# Patient Record
Sex: Male | Born: 1950 | Race: White | Hispanic: No | Marital: Married | Smoking: Never smoker
Health system: Southern US, Community
[De-identification: ages and names within clinical notes are randomized; demographics above are authoritative.]

## PROBLEM LIST (undated history)

## (undated) DIAGNOSIS — M1711 Unilateral primary osteoarthritis, right knee: Secondary | ICD-10-CM

## (undated) DIAGNOSIS — E785 Hyperlipidemia, unspecified: Secondary | ICD-10-CM

## (undated) DIAGNOSIS — N400 Enlarged prostate without lower urinary tract symptoms: Secondary | ICD-10-CM

## (undated) DIAGNOSIS — M549 Dorsalgia, unspecified: Secondary | ICD-10-CM

## (undated) DIAGNOSIS — E119 Type 2 diabetes mellitus without complications: Secondary | ICD-10-CM

## (undated) DIAGNOSIS — M2550 Pain in unspecified joint: Secondary | ICD-10-CM

## (undated) DIAGNOSIS — F419 Anxiety disorder, unspecified: Secondary | ICD-10-CM

## (undated) DIAGNOSIS — F32A Depression, unspecified: Secondary | ICD-10-CM

## (undated) DIAGNOSIS — I10 Essential (primary) hypertension: Secondary | ICD-10-CM

## (undated) DIAGNOSIS — F329 Major depressive disorder, single episode, unspecified: Secondary | ICD-10-CM

## (undated) DIAGNOSIS — M109 Gout, unspecified: Secondary | ICD-10-CM

## (undated) DIAGNOSIS — R0602 Shortness of breath: Secondary | ICD-10-CM

## (undated) DIAGNOSIS — R972 Elevated prostate specific antigen [PSA]: Secondary | ICD-10-CM

## (undated) HISTORY — DX: Major depressive disorder, single episode, unspecified: F32.9

## (undated) HISTORY — DX: Benign prostatic hyperplasia without lower urinary tract symptoms: N40.0

## (undated) HISTORY — DX: Elevated prostate specific antigen (PSA): R97.20

## (undated) HISTORY — DX: Type 2 diabetes mellitus without complications: E11.9

## (undated) HISTORY — DX: Hyperlipidemia, unspecified: E78.5

## (undated) HISTORY — DX: Gout, unspecified: M10.9

## (undated) HISTORY — DX: Anxiety disorder, unspecified: F41.9

## (undated) HISTORY — DX: Dorsalgia, unspecified: M54.9

## (undated) HISTORY — DX: Unilateral primary osteoarthritis, right knee: M17.11

## (undated) HISTORY — DX: Depression, unspecified: F32.A

## (undated) HISTORY — DX: Shortness of breath: R06.02

## (undated) HISTORY — PX: KNEE ARTHROPLASTY: SHX992

## (undated) HISTORY — DX: Essential (primary) hypertension: I10

## (undated) HISTORY — DX: Pain in unspecified joint: M25.50

---

## 2008-03-19 ENCOUNTER — Ambulatory Visit: Payer: Self-pay | Admitting: Gastroenterology

## 2008-03-28 ENCOUNTER — Ambulatory Visit: Payer: Self-pay | Admitting: Gastroenterology

## 2008-10-01 ENCOUNTER — Ambulatory Visit: Admission: RE | Admit: 2008-10-01 | Discharge: 2008-10-01 | Payer: Self-pay | Admitting: Family Medicine

## 2008-10-01 ENCOUNTER — Ambulatory Visit: Payer: Self-pay | Admitting: Vascular Surgery

## 2008-10-01 ENCOUNTER — Encounter: Payer: Self-pay | Admitting: Family Medicine

## 2008-10-04 ENCOUNTER — Emergency Department (HOSPITAL_COMMUNITY): Admission: EM | Admit: 2008-10-04 | Discharge: 2008-10-04 | Payer: Self-pay | Admitting: Emergency Medicine

## 2009-08-31 ENCOUNTER — Encounter: Payer: Self-pay | Admitting: Internal Medicine

## 2010-02-23 ENCOUNTER — Ambulatory Visit: Payer: Self-pay | Admitting: Internal Medicine

## 2010-02-23 DIAGNOSIS — E119 Type 2 diabetes mellitus without complications: Secondary | ICD-10-CM | POA: Insufficient documentation

## 2010-02-25 LAB — CONVERTED CEMR LAB
ALT: 17 units/L (ref 0–53)
AST: 17 units/L (ref 0–37)
Basophils Relative: 2.1 % (ref 0.0–3.0)
CO2: 27 meq/L (ref 19–32)
Calcium: 9.6 mg/dL (ref 8.4–10.5)
Cholesterol: 175 mg/dL (ref 0–200)
Direct LDL: 61.1 mg/dL
Eosinophils Relative: 1.3 % (ref 0.0–5.0)
GFR calc non Af Amer: 86.03 mL/min (ref >60)
HCT: 45.7 % (ref 39.0–52.0)
Hemoglobin: 15.6 g/dL (ref 13.0–17.0)
Lymphs Abs: 2.2 10*3/uL (ref 0.7–4.0)
MCV: 83.9 fL (ref 78.0–100.0)
Microalb Creat Ratio: 16.1 mg/g (ref 0.0–30.0)
Monocytes Absolute: 0.5 10*3/uL (ref 0.1–1.0)
RBC: 5.44 M/uL (ref 4.22–5.81)
Sodium: 142 meq/L (ref 135–145)
Total Bilirubin: 0.9 mg/dL (ref 0.3–1.2)
Total CHOL/HDL Ratio: 5
WBC: 7.5 10*3/uL (ref 4.5–10.5)

## 2010-04-19 ENCOUNTER — Telehealth: Payer: Self-pay | Admitting: Internal Medicine

## 2010-04-28 ENCOUNTER — Telehealth: Payer: Self-pay | Admitting: Internal Medicine

## 2010-04-29 ENCOUNTER — Telehealth: Payer: Self-pay | Admitting: Internal Medicine

## 2010-05-04 ENCOUNTER — Encounter: Payer: Self-pay | Admitting: Internal Medicine

## 2010-05-04 DIAGNOSIS — E291 Testicular hypofunction: Secondary | ICD-10-CM

## 2010-07-27 ENCOUNTER — Telehealth: Payer: Self-pay | Admitting: Internal Medicine

## 2010-11-02 NOTE — Progress Notes (Signed)
Summary: more samples needed  Phone Note Call from Patient Call back at Home Phone [phone]   Caller: Patient---live call Summary of Call: Would like more samples of Onglya. Initial call taken by: Warnell Forester,  April 19, 2010 1:14 PM  Follow-up for Phone Call        OK if available Follow-up by: Gordy Savers  MD,  April 19, 2010 5:20 PM  Additional Follow-up for Phone Call Additional follow up Details #1::        attempt to call - ans mach - LMTCB igf questions  - samples up front for pick up. KIK Additional Follow-up by: Duard Brady LPN,  April 20, 2010 8:38 AM

## 2010-11-02 NOTE — Letter (Signed)
Summary: Records from Dr. Tamela Gammon Office 2009 - 2010  Records from Dr. Tamela Gammon Office 2009 - 2010   Imported By: Maryln Gottron 05/24/2010 12:53:33  _____________________________________________________________________  External Attachment:    Type:   Image     Comment:   External Document

## 2010-11-02 NOTE — Assessment & Plan Note (Signed)
Summary: to be est/dm/njr   Vital Signs:  Patient profile:   60 year old male Height:      68 inches Weight:      210 pounds BMI:     32.05 Temp:     98.2 degrees F oral BP sitting:   124 / 70  (right arm) Cuff size:   regular  Vitals Entered By: Duard Brady LPN (Feb 23, 2010 3:16 PM) CC: new to estblish -    Is Patient Diabetic? Yes Did you bring your meter with you today? No CBG Result 241   CC:  new to estblish -   .  History of Present Illness: 29 -year-old patient who is in today to establish with our practice.  He has a long history of type 2 diabetes since 1995.  He has a history of proteinuria and has been on ARB for renal protection.  He has had cough with ACE inhibition in the past.  No history of hypertension.  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  He is also followed by urology due to a chronically elevated PSA in the 15 range.  He has had negative biopsies on 3 prior occasions  Preventive Screening-Counseling & Management  Alcohol-Tobacco     Smoking Status: quit  Allergies (verified): No Known Drug Allergies  Past History:  Past Medical History: Diabetes mellitus, type II- 1995 testosterone deficiency elevated PSA mild obesity  Past Surgical History: colonoscopy prostate biopsy x 3  Family History: Reviewed history and no changes required. father died in age 50, complications a coronary artery disease, diabetes and cerebral vascular disease mother age 2, and is in remarkably good health one brother with gallstones and peptic ulcer disease one sister with diabetes  Social History: Reviewed history and no changes required. Ph.D./Rabbi married two children, age 89, and 26Smoking Status:  quit  Review of Systems       The patient complains of weight loss.  The patient denies anorexia, fever, weight gain, vision loss, decreased hearing, hoarseness, chest pain, syncope, dyspnea on exertion, peripheral edema, prolonged  cough, headaches, hemoptysis, abdominal pain, melena, hematochezia, severe indigestion/heartburn, hematuria, incontinence, genital sores, muscle weakness, suspicious skin lesions, transient blindness, difficulty walking, depression, unusual weight change, abnormal bleeding, enlarged lymph nodes, angioedema, breast masses, and testicular masses.    Physical Exam  General:  overweight-appearing.  140/80 Head:  Normocephalic and atraumatic without obvious abnormalities. No apparent alopecia or balding. Eyes:  No corneal or conjunctival inflammation noted. EOMI. Perrla. Funduscopic exam benign, without hemorrhages, exudates or papilledema. Vision grossly normal. Ears:  External ear exam shows no significant lesions or deformities.  Otoscopic examination reveals clear canals, tympanic membranes are intact bilaterally without bulging, retraction, inflammation or discharge. Hearing is grossly normal bilaterally. Mouth:  Oral mucosa and oropharynx without lesions or exudates.  Teeth in good repair. Neck:  No deformities, masses, or tenderness noted. Lungs:  Normal respiratory effort, chest expands symmetrically. Lungs are clear to auscultation, no crackles or wheezes. Heart:  Normal rate and regular rhythm. S1 and S2 normal without gallop, murmur, click, rub or other extra sounds. Abdomen:  Bowel sounds positive,abdomen soft and non-tender without masses, organomegaly or hernias noted. Msk:  No deformity or scoliosis noted of thoracic or lumbar spine.   Pulses:  R and L carotid,radial,femoral,dorsalis pedis and posterior tibial pulses are full and equal bilaterally Extremities:  No clubbing, cyanosis, edema, or deformity noted with normal full range of motion of all joints.   Neurologic:  No  cranial nerve deficits noted. Station and gait are normal. Plantar reflexes are down-going bilaterally. DTRs are symmetrical throughout. Sensory, motor and coordinative functions appear intact. Skin:  Intact without  suspicious lesions or rashes Cervical Nodes:  No lymphadenopathy noted Axillary Nodes:  No palpable lymphadenopathy Inguinal Nodes:  No significant adenopathy Psych:  Cognition and judgment appear intact. Alert and cooperative with normal attention span and concentration. No apparent delusions, illusions, hallucinations  Diabetes Management Exam:    Foot Exam (with socks and/or shoes not present):       Sensory-Pinprick/Light touch:          Left medial foot (L-4): normal          Left dorsal foot (L-5): normal          Left lateral foot (S-1): normal          Right medial foot (L-4): normal          Right dorsal foot (L-5): normal          Right lateral foot (S-1): normal       Sensory-Monofilament:          Left foot: normal          Right foot: normal       Inspection:          Left foot: normal          Right foot: normal       Nails:          Left foot: normal          Right foot: normal    Foot Exam by Podiatrist:       Date: 02/23/2010       Results: no diabetic findings       Done by: PCP    Eye Exam:       Eye Exam done here today          Results: normal   Impression & Recommendations:  Problem # 1:  DIABETES MELLITUS, TYPE II (ICD-250.00)  His updated medication list for this problem includes:    Aspirin 325 Mg Tabs (Aspirin) ..... Qd    Onglyza 5 Mg Tabs (Saxagliptin hcl) ..... Qd    Glucophage Xr 500 Mg Xr24h-tab (Metformin hcl) .Marland KitchenMarland KitchenMarland KitchenMarland Kitchen 4 tablets daily    Glimepiride 4 Mg Tabs (Glimepiride) .Marland Kitchen..Marland Kitchen Two tablets daily    Losartan Potassium 100 Mg Tabs (Losartan potassium) ..... One daily  Orders: Capillary Blood Glucose/CBG (76283) Venipuncture (15176) TLB-A1C / Hgb A1C (Glycohemoglobin) (83036-A1C) TLB-BMP (Basic Metabolic Panel-BMET) (80048-METABOL) TLB-CBC Platelet - w/Differential (85025-CBCD) TLB-Hepatic/Liver Function Pnl (80076-HEPATIC) TLB-TSH (Thyroid Stimulating Hormone) (84443-TSH) TLB-Microalbumin/Creat Ratio, Urine (82043-MALB) TLB-Lipid Panel  (80061-LIPID)  Problem # 2:  Preventive Health Care (ICD-V70.0)  Complete Medication List: 1)  Aspirin 325 Mg Tabs (Aspirin) .... Qd 2)  Onglyza 5 Mg Tabs (Saxagliptin hcl) .... Qd 3)  Glucophage Xr 500 Mg Xr24h-tab (Metformin hcl) .... 4 tablets daily 4)  Glimepiride 4 Mg Tabs (Glimepiride) .... Two tablets daily 5)  Androgel 50 Mg/5gm Gel (Testosterone) .... Apply daily in the morning 6)  Losartan Potassium 100 Mg Tabs (Losartan potassium) .... One daily  Patient Instructions: 1)  Please schedule a follow-up appointment in 3 months. 2)  It is important that you exercise regularly at least 20 minutes 5 times a week. If you develop chest pain, have severe difficulty breathing, or feel very tired , stop exercising immediately and seek medical attention. 3)  You need to lose weight. Consider a  lower calorie diet and regular exercise.  4)  Check your blood sugars regularly. If your readings are usually above : or below 70 you should contact our office. 5)  It is important that your Diabetic A1c level is checked every 3 months. 6)  See your eye doctor yearly to check for diabetic eye damage. Prescriptions: LOSARTAN POTASSIUM 100 MG TABS (LOSARTAN POTASSIUM) one daily  #90 x 6   Entered and Authorized by:   Gordy Savers  MD   Signed by:   Gordy Savers  MD on 02/23/2010   Method used:   Print then Give to Patient   RxID:   [id number] ANDROGEL 50 MG/5GM GEL (TESTOSTERONE) apply daily in the morning  #90 packets x 6   Entered and Authorized by:   Gordy Savers  MD   Signed by:   Gordy Savers  MD on 02/23/2010   Method used:   Print then Give to Patient   RxID:   [id number] GLIMEPIRIDE 4 MG TABS (GLIMEPIRIDE) two tablets daily  #180 x 6   Entered and Authorized by:   Gordy Savers  MD   Signed by:   Gordy Savers  MD on 02/23/2010   Method used:   Print then Give to Patient   RxID:   [id number] GLUCOPHAGE XR 500 MG XR24H-TAB  (METFORMIN HCL) 4 tablets daily  #360 x 6   Entered and Authorized by:   Gordy Savers  MD   Signed by:   Gordy Savers  MD on 02/23/2010   Method used:   Print then Give to Patient   RxID:   [id number] ONGLYZA 5 MG TABS (SAXAGLIPTIN HCL) qd  #90 x 6   Entered and Authorized by:   Gordy Savers  MD   Signed by:   Gordy Savers  MD on 02/23/2010   Method used:   Print then Give to Patient   RxID:   [id number]

## 2010-11-02 NOTE — Medication Information (Signed)
Summary: Androderm Approved  Androderm Approved   Imported By: Maryln Gottron 05/07/2010 09:44:38  _____________________________________________________________________  External Attachment:    Type:   Image     Comment:   External Document

## 2010-11-02 NOTE — Progress Notes (Signed)
Summary: rx androgel  Phone Note Refill Request Message from:  Fax from Pharmacy on April 29, 2010 11:21 AM  Refills Requested: Medication #1:  ANDROGEL 50 MG/5GM GEL apply daily in the morning switching pharmacy - to [facility] [address]   Method Requested: Fax to [facility] Initial call taken by: Duard Brady LPN,  April 29, 2010 11:21 AM    Prescriptions: ANDROGEL 50 MG/5GM GEL (TESTOSTERONE) apply daily in the morning  #90 packets x 6   Entered by:   Duard Brady LPN   Authorized by:   Gordy Savers  MD   Signed by:   Duard Brady LPN on 16/07/9603   Method used:   Historical   RxID:   [id number]  called to [facility]. KIK

## 2010-11-02 NOTE — Progress Notes (Signed)
Summary: Pt req new script for Onglyza 5mg  to [facility]  Phone Note Refill Request Call back at [facility] Phone [phone]   Refills Requested: Medication #1:  ONGLYZA 5 MG TABS qd   Dosage confirmed as above?Dosage Confirmed Pls call in to [facility] [address]. Pt didnt use all of previous script, so script has expired.     Method Requested: Telephone to Pharmacy Initial call taken by: Lucy Antigua,  July 27, 2010 9:41 AM    Prescriptions: ONGLYZA 5 MG TABS (SAXAGLIPTIN HCL) qd  #90 x 1   Entered by:   Willy Eddy, LPN   Authorized by:   Gordy Savers  MD   Signed by:   Willy Eddy, LPN on 14/78/2956   Method used:   Electronically to        [facility] [address] (retail)       [address]       [place], [place]  [address]       Ph: [phone]       Fax: [phone]   RxID:   [id number]

## 2010-11-02 NOTE — Miscellaneous (Signed)
  Clinical Lists Changes  Problems: Added new problem of HYPOGONADISM (ICD-257.2) 

## 2010-11-02 NOTE — Progress Notes (Signed)
Summary: Pt req script for Accucheck test strips - 90 day supply  Phone Note Refill Request Call back at Home Phone [phone] Message from:  Patient on April 28, 2010 3:15 PM  Refills Requested: Medication #1:  ACCU-CHEK AVIVA  STRP use daily.   Supply Requested: 3 months [facility] [address]   Method Requested: Telephone to Pharmacy Initial call taken by: Lucy Antigua,  April 28, 2010 3:14 PM  Follow-up for Phone Call        attempt to call - voice mail - LMTCB - rx for 90 days with 6refills was sent out on 02/25/10 to [facility]. Please let me know if rx needs to be switched to [facility] and I will be happy to change rx. KIK Follow-up by: Duard Brady LPN,  April 29, 2010 9:03 AM  Additional Follow-up for Phone Call Additional follow up Details #1::        pt call left msg - would like rx to [facility].   faxed - pt aware. KIK Additional Follow-up by: Duard Brady LPN,  April 29, 2010 9:55 AM    Prescriptions: ACCU-CHEK AVIVA  STRP (GLUCOSE BLOOD) use daily  #100 x 4   Entered by:   Duard Brady LPN   Authorized by:   Gordy Savers  MD   Signed by:   Duard Brady LPN on 09/81/1914   Method used:   Faxed to ...       [facility] [address] (retail)       [address]       [place], [place]  [address]       Ph: [phone]       Fax: [phone]   RxID:   [id number]

## 2011-01-17 LAB — CBC
Platelets: 468 10*3/uL — ABNORMAL HIGH (ref 150–400)
WBC: 15.3 10*3/uL — ABNORMAL HIGH (ref 4.0–10.5)

## 2011-01-17 LAB — BASIC METABOLIC PANEL
BUN: 15 mg/dL (ref 6–23)
Calcium: 9.6 mg/dL (ref 8.4–10.5)
Creatinine, Ser: 0.98 mg/dL (ref 0.4–1.5)
GFR calc non Af Amer: >60 mL/min (ref >60)
Potassium: 4.4 mEq/L (ref 3.5–5.1)

## 2011-01-17 LAB — PROTIME-INR: INR: 1.1 (ref 0.00–1.49)

## 2011-01-17 LAB — DIFFERENTIAL
Lymphocytes Relative: 13 % (ref 12–46)
Lymphs Abs: 1.9 10*3/uL (ref 0.7–4.0)
Neutro Abs: 12 10*3/uL — ABNORMAL HIGH (ref 1.7–7.7)
Neutrophils Relative %: 78 % — ABNORMAL HIGH (ref 43–77)

## 2011-01-17 LAB — GLUCOSE, CAPILLARY: Glucose-Capillary: 115 mg/dL — ABNORMAL HIGH (ref 70–99)

## 2014-09-28 ENCOUNTER — Ambulatory Visit (INDEPENDENT_AMBULATORY_CARE_PROVIDER_SITE_OTHER): Payer: Commercial Managed Care - PPO

## 2014-09-28 ENCOUNTER — Ambulatory Visit (HOSPITAL_COMMUNITY)
Admission: RE | Admit: 2014-09-28 | Discharge: 2014-09-28 | Disposition: A | Discharge status: Home / Self Care | Payer: Commercial Managed Care - PPO | Source: Ambulatory Visit | Attending: Emergency Medicine | Admitting: Emergency Medicine

## 2014-09-28 ENCOUNTER — Encounter: Payer: Self-pay | Admitting: Emergency Medicine

## 2014-09-28 ENCOUNTER — Ambulatory Visit (INDEPENDENT_AMBULATORY_CARE_PROVIDER_SITE_OTHER): Payer: Commercial Managed Care - PPO | Admitting: Emergency Medicine

## 2014-09-28 VITALS — BP 128/82 | HR 82 | Temp 99.2°F | Resp 17 | Ht 67.5 in | Wt 230.0 lb

## 2014-09-28 DIAGNOSIS — M79671 Pain in right foot: Secondary | ICD-10-CM

## 2014-09-28 DIAGNOSIS — M7989 Other specified soft tissue disorders: Secondary | ICD-10-CM

## 2014-09-28 DIAGNOSIS — R609 Edema, unspecified: Secondary | ICD-10-CM | POA: Insufficient documentation

## 2014-09-28 LAB — POCT CBC
GRANULOCYTE PERCENT: 70.7 % (ref 37–80)
HCT, POC: 49.9 % (ref 43.5–53.7)
Hemoglobin: 16.4 g/dL (ref 14.1–18.1)
Lymph, poc: 2.5 (ref 0.6–3.4)
MCH: 27.2 pg (ref 27–31.2)
MCHC: 32.8 g/dL (ref 31.8–35.4)
MCV: 83 fL (ref 80–97)
MID (CBC): 0.6 (ref 0–0.9)
MPV: 7.7 fL (ref 0–99.8)
POC Granulocyte: 7.6 — AB (ref 2–6.9)
POC LYMPH %: 23.3 % (ref 10–50)
POC MID %: 6 %M (ref 0–12)
Platelet Count, POC: 323 10*3/uL (ref 142–424)
RBC: 6.02 M/uL (ref 4.69–6.13)
RDW, POC: 14.3 %
WBC: 10.7 10*3/uL — AB (ref 4.6–10.2)

## 2014-09-28 LAB — URIC ACID: URIC ACID, SERUM: 8.6 mg/dL — AB (ref 4.0–7.8)

## 2014-09-28 LAB — BASIC METABOLIC PANEL
BUN: 23 mg/dL (ref 6–23)
CO2: 23 meq/L (ref 19–32)
Calcium: 9.3 mg/dL (ref 8.4–10.5)
Chloride: 103 mEq/L (ref 96–112)
Creat: 1.18 mg/dL (ref 0.50–1.35)
GLUCOSE: 156 mg/dL — AB (ref 70–99)
POTASSIUM: 4.7 meq/L (ref 3.5–5.3)
SODIUM: 139 meq/L (ref 135–145)

## 2014-09-28 LAB — GLUCOSE, POCT (MANUAL RESULT ENTRY): POC GLUCOSE: 165 mg/dL — AB (ref 70–99)

## 2014-09-28 MED ORDER — HYDROCODONE-ACETAMINOPHEN 5-325 MG PO TABS: 1.0000 | ORAL_TABLET | Freq: Four times a day (QID) | ORAL | Status: DC | PRN

## 2014-09-28 MED ORDER — DOXYCYCLINE HYCLATE 100 MG PO TABS: 100.0000 mg | ORAL_TABLET | Freq: Two times a day (BID) | ORAL | Status: DC

## 2014-09-28 NOTE — Progress Notes (Signed)
Subjective:  This chart was scribed for Collene GobbleSteven A Eugene Isadore, MD, by Elon SpannerGarrett Cook, ED Scribe. This patient was seen in room Rm 5 and the patient's care was started at 11:45 AM.   Patient ID: Bernard LeanPhilip Roberts, male    DOB: Feb 15, 1951, 63 y.o.   MRN: [id number]  HPI HPI Comments: Bernard Roberts is a 63 y.o. male with a history of DM who presents to the Emergency Department complaining of recent worsening of intermittent right foot pain onset 1 week ago with associated swelling onset yesterday.  Patient reports he was on a flight yesterday after which the swelling onset.  Patient has taken ibuprofen without relief.  Patient reports he has had episodes similar to this in the past but they spontaneously resolve and have not accompanied by swelling.  Patient was seen by an orthopaedist 1 month ago and was referred to a podiatrist and has an appointment in 2 weeks.  Patient states he has been diagnosed with gout in the past, but a subsequent physician told the patient he did not have gout.  However, he has previous taken colchicine with relief.   Patient is able to ambulate with pain.  Patient does not live in the area.   Patient denies history of DVT's, PE's.  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  Patient denies history of cardiac conditions, CVA.    Review of Systems  Cardiovascular: Positive for leg swelling.  Musculoskeletal: Positive for arthralgias.  History of Factor 11 deficiency     Objective:   Physical Exam  Constitutional: He is oriented to person, place, and time. He appears well-developed and well-nourished. No distress.  HENT:  Head: Normocephalic and atraumatic.  Eyes: Conjunctivae and EOM are normal.  Neck: Neck supple. No tracheal deviation present.  Cardiovascular: Normal rate.   Pulmonary/Chest: Effort normal. No respiratory distress.  Musculoskeletal: Normal range of motion.  Right leg: stasis changes.  Tense swelling of right calf on the right. No definite tenderness.  There is  swelling which extends down over the foot.  Pulses are 2+.  There is 3+ pitting edema  Neurological: He is alert and oriented to person, place, and time.  Skin: Skin is warm and dry.  Psychiatric: He has a normal mood and affect. His behavior is normal.  Nursing note and vitals reviewed.  Results for orders placed or performed in visit on 09/28/14  POCT CBC  Result Value Ref Range   WBC 10.7 (A) 4.6 - 10.2 K/uL   Lymph, poc 2.5 0.6 - 3.4   POC LYMPH PERCENT 23.3 10 - 50 %L   MID (cbc) 0.6 0 - 0.9   POC MID % 6.0 0 - 12 %M   POC Granulocyte 7.6 (A) 2 - 6.9   Granulocyte percent 70.7 37 - 80 %G   RBC 6.02 4.69 - 6.13 M/uL   Hemoglobin 16.4 14.1 - 18.1 g/dL   HCT, POC 82.949.9 56.243.5 - 53.7 %   MCV 83.0 80 - 97 fL   MCH, POC 27.2 27 - 31.2 pg   MCHC 32.8 31.8 - 35.4 g/dL   RDW, POC 13.014.3 %   Platelet Count, POC 323 142 - 424 K/uL   MPV 7.7 0 - 99.8 fL  POCT glucose (manual entry)  Result Value Ref Range   POC Glucose 165 (A) 70 - 99 mg/dl  [facility] reading (PRIMARY) by  Dr. Cleta Albertsaub no fracture seen Doppler negative for DVT.   11:52 AM Will order labs and imaging.  [facility] on suspicion of DVT.  Patient acknowledges and agrees with plan.       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 He was sent to the hospital for emergent Doppler study treatment depending on what we find.I personally performed the services described in this documentation, which was scribed in my presence. The recorded information has been reviewed and is accurate. Since his Doppler study was normal  the patient come back and. He's going to keep his leg elevated he is going to take doxycycline twice a day. He will call tomorrow with progress report make a decision about orthopedic evaluation. Of note he did have a low-grade temp of 99 .2 and a white count of 10,700.

## 2014-09-28 NOTE — Progress Notes (Signed)
VASCULAR LAB PRELIMINARY  PRELIMINARY  PRELIMINARY  PRELIMINARY  Bilateral lower extremity venous Dopplers completed.    Preliminary report:  There is no DVT or SVT noted in the bilateral lower extremities.   Sani Loiseau, RVT 09/28/2014, 1:55 PM

## 2014-09-29 ENCOUNTER — Other Ambulatory Visit: Payer: Self-pay | Admitting: Family Medicine

## 2014-09-29 DIAGNOSIS — M109 Gout, unspecified: Secondary | ICD-10-CM

## 2014-09-29 MED ORDER — COLCHICINE 0.6 MG PO TABS: 0.6000 mg | ORAL_TABLET | Freq: Two times a day (BID) | ORAL | Status: DC

## 2014-09-29 NOTE — Progress Notes (Signed)
See lab result note. Colchicine sent to pharmacy. Advised patient to stop Simvastatin for the week he is on Colchicine. He voiced understanding.

## 2015-07-10 ENCOUNTER — Encounter: Payer: Self-pay | Admitting: Gastroenterology

## 2016-04-14 ENCOUNTER — Ambulatory Visit (INDEPENDENT_AMBULATORY_CARE_PROVIDER_SITE_OTHER): Payer: PPO | Admitting: Adult Health

## 2016-04-14 ENCOUNTER — Encounter: Payer: Self-pay | Admitting: Adult Health

## 2016-04-14 VITALS — BP 104/64 | Temp 98.6°F | Ht 67.5 in | Wt 197.2 lb

## 2016-04-14 DIAGNOSIS — Z794 Long term (current) use of insulin: Secondary | ICD-10-CM

## 2016-04-14 DIAGNOSIS — Z7189 Other specified counseling: Secondary | ICD-10-CM | POA: Diagnosis not present

## 2016-04-14 DIAGNOSIS — G4700 Insomnia, unspecified: Secondary | ICD-10-CM

## 2016-04-14 DIAGNOSIS — E291 Testicular hypofunction: Secondary | ICD-10-CM

## 2016-04-14 DIAGNOSIS — E119 Type 2 diabetes mellitus without complications: Secondary | ICD-10-CM

## 2016-04-14 DIAGNOSIS — Z7689 Persons encountering health services in other specified circumstances: Secondary | ICD-10-CM

## 2016-04-14 DIAGNOSIS — M25561 Pain in right knee: Secondary | ICD-10-CM

## 2016-04-14 MED ORDER — MELOXICAM 7.5 MG PO TABS: 7.5000 mg | ORAL_TABLET | Freq: Every day | ORAL | Status: DC

## 2016-04-14 MED ORDER — ZOLPIDEM TARTRATE 5 MG PO TABS: 5.0000 mg | ORAL_TABLET | Freq: Every evening | ORAL | Status: DC | PRN

## 2016-04-14 NOTE — Patient Instructions (Addendum)
It was great meeting you today!  Someone from Urology and Podiatry will call you to establish care with them   Follow up with me this fall for your complete physical. If you need anything, please let me know  Health Maintenance, Male A healthy lifestyle and preventative care can promote health and wellness.  Maintain regular health, dental, and eye exams.  Eat a healthy diet. Foods like vegetables, fruits, whole grains, low-fat dairy products, and lean protein foods contain the nutrients you need and are low in calories. Decrease your intake of foods high in solid fats, added sugars, and salt. Get information about a proper diet from your health care provider, if necessary.  Regular physical exercise is one of the most important things you can do for your health. Most adults should get at least 150 minutes of moderate-intensity exercise (any activity that increases your heart rate and causes you to sweat) each week. In addition, most adults need muscle-strengthening exercises on 2 or more days a week.   Maintain a healthy weight. The body mass index (BMI) is a screening tool to identify possible weight problems. It provides an estimate of body fat based on height and weight. Your health care provider can find your BMI and can help you achieve or maintain a healthy weight. For males 20 years and older:  A BMI below 18.5 is considered underweight.  A BMI of 18.5 to 24.9 is normal.  A BMI of 25 to 29.9 is considered overweight.  A BMI of 30 and above is considered obese.  Maintain normal blood lipids and cholesterol by exercising and minimizing your intake of saturated fat. Eat a balanced diet with plenty of fruits and vegetables. Blood tests for lipids and cholesterol should begin at age 66 and be repeated every 5 years. If your lipid or cholesterol levels are high, you are over age 66, or you are at high risk for heart disease, you may need your cholesterol levels checked more  frequently.Ongoing high lipid and cholesterol levels should be treated with medicines if diet and exercise are not working.  If you smoke, find out from your health care provider how to quit. If you do not use tobacco, do not start.  Lung cancer screening is recommended for adults aged 55-80 years who are at high risk for developing lung cancer because of a history of smoking. A yearly low-dose CT scan of the lungs is recommended for people who have at least a 30-pack-year history of smoking and are current smokers or have quit within the past 15 years. A pack year of smoking is smoking an average of 1 pack of cigarettes a day for 1 year (for example, a 30-pack-year history of smoking could mean smoking 1 pack a day for 30 years or 2 packs a day for 15 years). Yearly screening should continue until the smoker has stopped smoking for at least 15 years. Yearly screening should be stopped for people who develop a health problem that would prevent them from having lung cancer treatment.  If you choose to drink alcohol, do not have more than 2 drinks per day. One drink is considered to be 12 oz (360 mL) of beer, 5 oz (150 mL) of wine, or 1.5 oz (45 mL) of liquor.  Avoid the use of street drugs. Do not share needles with anyone. Ask for help if you need support or instructions about stopping the use of drugs.  High blood pressure causes heart disease and increases the risk  of stroke. High blood pressure is more likely to develop in:  People who have blood pressure in the end of the normal range (100-139/85-89 mm Hg).  People who are overweight or obese.  People who are African American.  If you are 30-53 years of age, have your blood pressure checked every 3-5 years. If you are 14 years of age or older, have your blood pressure checked every year. You should have your blood pressure measured twice--once when you are at a hospital or clinic, and once when you are not at a hospital or clinic. Record the  average of the two measurements. To check your blood pressure when you are not at a hospital or clinic, you can use:  An automated blood pressure machine at a pharmacy.  A home blood pressure monitor.  If you are 16-3 years old, ask your health care provider if you should take aspirin to prevent heart disease.  Diabetes screening involves taking a blood sample to check your fasting blood sugar level. This should be done once every 3 years after age 80 if you are at a normal weight and without risk factors for diabetes. Testing should be considered at a younger age or be carried out more frequently if you are overweight and have at least 1 risk factor for diabetes.  Colorectal cancer can be detected and often prevented. Most routine colorectal cancer screening begins at the age of 77 and continues through age 69. However, your health care provider may recommend screening at an earlier age if you have risk factors for colon cancer. On a yearly basis, your health care provider may provide home test kits to check for hidden blood in the stool. A small camera at the end of a tube may be used to directly examine the colon (sigmoidoscopy or colonoscopy) to detect the earliest forms of colorectal cancer. Talk to your health care provider about this at age 49 when routine screening begins. A direct exam of the colon should be repeated every 5-10 years through age 16, unless early forms of precancerous polyps or small growths are found.  People who are at an increased risk for hepatitis B should be screened for this virus. You are considered at high risk for hepatitis B if:  You were born in a country where hepatitis B occurs often. Talk with your health care provider about which countries are considered high risk.  Your parents were born in a high-risk country and you have not received a shot to protect against hepatitis B (hepatitis B vaccine).  You have HIV or AIDS.  You use needles to inject street  drugs.  You live with, or have sex with, someone who has hepatitis B.  You are a man who has sex with other men (MSM).  You get hemodialysis treatment.  You take certain medicines for conditions like cancer, organ transplantation, and autoimmune conditions.  Hepatitis C blood testing is recommended for all people born from [birth year] through 1966 and any individual with known risk factors for hepatitis C.  Healthy men should no longer receive prostate-specific antigen (PSA) blood tests as part of routine cancer screening. Talk to your health care provider about prostate cancer screening.  Testicular cancer screening is not recommended for adolescents or adult males who have no symptoms. Screening includes self-exam, a health care provider exam, and other screening tests. Consult with your health care provider about any symptoms you have or any concerns you have about testicular cancer.  Practice safe sex.  Use condoms and avoid high-risk sexual practices to reduce the spread of sexually transmitted infections (STIs).  You should be screened for STIs, including gonorrhea and chlamydia if:  You are sexually active and are younger than 24 years.  You are older than 24 years, and your health care provider tells you that you are at risk for this type of infection.  Your sexual activity has changed since you were last screened, and you are at an increased risk for chlamydia or gonorrhea. Ask your health care provider if you are at risk.  If you are at risk of being infected with HIV, it is recommended that you take a prescription medicine daily to prevent HIV infection. This is called pre-exposure prophylaxis (PrEP). You are considered at risk if:  You are a man who has sex with other men (MSM).  You are a heterosexual man who is sexually active with multiple partners.  You take drugs by injection.  You are sexually active with a partner who has HIV.  Talk with your health care provider about  whether you are at high risk of being infected with HIV. If you choose to begin PrEP, you should first be tested for HIV. You should then be tested every 3 months for as long as you are taking PrEP.  Use sunscreen. Apply sunscreen liberally and repeatedly throughout the day. You should seek shade when your shadow is shorter than you. Protect yourself by wearing long sleeves, pants, a wide-brimmed hat, and sunglasses year round whenever you are outdoors.  Tell your health care provider of new moles or changes in moles, especially if there is a change in shape or color. Also, tell your health care provider if a mole is larger than the size of a pencil eraser.  A one-time screening for abdominal aortic aneurysm (AAA) and surgical repair of large AAAs by ultrasound is recommended for men aged 72-75 years who are current or former smokers.  Stay current with your vaccines (immunizations).   This information is not intended to replace advice given to you by your health care provider. Make sure you discuss any questions you have with your health care provider.   Document Released: 03/17/2008 Document Revised: 10/10/2014 Document Reviewed: 02/14/2011 Elsevier Interactive Patient Education [employer].

## 2016-04-14 NOTE — Progress Notes (Signed)
Patient presents to clinic today to establish care. He is a pleasant caucasian male who  has a past medical history of Diabetes mellitus without complication (HCC); Gout; Hypertension; and Hyperlipidemia.  His last physical was in   Acute Concerns: Nocturia    Chronic Issues: Diabetes Mellitus - He feels as though it is well controlled. His last physical. He has been doing a Ketogenic diet    Elevated PSA - Has been seen by [facility] in the past and has had three biopsies in the past. He would also like to see Urology so that he can have his testosterone tested. He has taken testosterone shots in the past  Chronic right knee pain  - Has chronic right knee pain. Pain  Insomnia  - Chronic issue. Has to take Ambien 5 nights a week. Has been taking it for 1.5 years.   Health Maintenance: Dental -- Yearly  Vision -- Yearly  Immunizations -- Will get records from previous providers.  Colonoscopy --03/2014 - every 10 years   Diet: Ketogenic Diet Exercise: Swims  He is [place] by  [place] [place] [place]   Past Medical History  Diagnosis Date  . Diabetes mellitus without complication (HCC)   . Gout   . Hypertension   . Hyperlipidemia     No past surgical history on file.  No current outpatient prescriptions on file prior to visit.   No current facility-administered medications on file prior to visit.    No Known Allergies  Family History  Problem Relation Age of Onset  . Mental retardation Mother   . Diabetes Father   . Heart disease Father   . Hypertension Father   . Stroke Father   . Diabetes Sister   . Mental retardation Sister   . Alcohol abuse Brother   . Hypertension Brother     Social History   Social History  . Marital Status: Married    Spouse Name: N/A  . Number of Children: N/A  . Years of Education: N/A   Occupational History  . Not on file.   Social History Main Topics  . Smoking status: Never  Smoker   . Smokeless tobacco: Not on file  . Alcohol Use: Not on file  . Drug Use: Not on file  . Sexual Activity: Not on file   Other Topics Concern  . Not on file   Social History Narrative    Review of Systems  Constitutional: Negative.   Respiratory: Negative.   Cardiovascular: Negative.   Gastrointestinal: Negative.   Genitourinary: Negative.   Neurological: Negative.   All other systems reviewed and are negative.   BP 104/64 mmHg  Temp(Src) 98.6 F (37 C) (Oral)  Ht 5' 7.5" (1.715 m)  Wt 197 lb 3.2 oz (89.449 kg)  BMI 30.41 kg/m2  Physical Exam  Constitutional: He is oriented to person, place, and time and well-developed, well-nourished, and in no distress. No distress.  HENT:  Head: Normocephalic and atraumatic.  Right Ear: External ear normal.  Left Ear: External ear normal.  Nose: Nose normal.  Mouth/Throat: Oropharynx is clear and moist. No oropharyngeal exudate.  Eyes: Conjunctivae and EOM are normal. Pupils are equal, round, and reactive to light. Right eye exhibits no discharge.  Cardiovascular: Normal rate, regular rhythm, normal heart sounds and intact distal pulses.  Exam reveals no gallop and no friction rub.   No murmur heard. Pulmonary/Chest: Effort normal and breath sounds normal. No respiratory distress. He  has no wheezes. He has no rales. He exhibits no tenderness.  Musculoskeletal: Normal range of motion. He exhibits no edema or tenderness.  Neurological: He is alert and oriented to person, place, and time. Gait normal. GCS score is 15.  Skin: Skin is warm and dry. No rash noted. He is not diaphoretic. No erythema. No pallor.  Psychiatric: Mood, memory, affect and judgment normal.  Nursing note and vitals reviewed.   Assessment/Plan:  1. Encounter to establish care - Follow up for CPE - Continue to eat healthy and exercise - Follow up as needed for any acute problems  2. Controlled type 2 diabetes mellitus without complication, with  long-term current use of insulin (HCC) - Appears to be well controlled on current medications - Will check A1c at physical  - Ambulatory referral to Podiatry - Continue to monitor blood sugars at home 3. Hypogonadism in male - Ambulatory referral to Urology  4. Insomnia - zolpidem (AMBIEN) 5 MG tablet; Take 1 tablet (5 mg total) by mouth at bedtime as needed for sleep.  Dispense: 30 tablet; Refill: 0  5. Right knee pain - meloxicam (MOBIC) 7.5 MG tablet; Take 1 tablet (7.5 mg total) by mouth daily.  Dispense: 30 tablet; Refill: 0 - Consider referral to ortho   Shirline Freesory Beretta Ginsberg, NP

## 2016-04-20 ENCOUNTER — Encounter: Payer: Self-pay | Admitting: Podiatry

## 2016-04-20 ENCOUNTER — Ambulatory Visit (INDEPENDENT_AMBULATORY_CARE_PROVIDER_SITE_OTHER): Payer: PPO | Admitting: Podiatry

## 2016-04-20 VITALS — BP 117/75 | HR 77 | Resp 14

## 2016-04-20 DIAGNOSIS — E1140 Type 2 diabetes mellitus with diabetic neuropathy, unspecified: Secondary | ICD-10-CM

## 2016-04-20 NOTE — Progress Notes (Signed)
   Subjective:    Patient ID: Bernard Roberts, male    DOB: 1950/12/10, 65 y.o.   MRN: [id number]  HPI this patient presents the office stating that he has been experiencing numbness and tingling in his feet. He says he has been a diabetic for 15 years, but has never been evaluated by a podiatrist.  He states [place]. He has been taking metformin as well as insulin for his diabetes, which he says has been under better control the last 6 months. He presents the office to discuss neuropathy in his feet    Review of Systems  All other systems reviewed and are negative.      Objective:   Physical Exam GENERAL APPEARANCE: Alert, conversant. Appropriately groomed. No acute distress.  VASCULAR: Pedal pulses are  palpable at  [facility] bilateral.  Capillary refill time is immediate to all digits,  Normal temperature gradient.  Digital hair growth is present bilateral  NEUROLOGIC: sensation is normal to 5.07 monofilament at 5/5 sites bilateral.  Light touch is intact bilateral, Muscle strength normal.  MUSCULOSKELETAL: acceptable muscle strength, tone and stability bilateral.  Intrinsic muscluature intact bilateral.  Rectus appearance of foot and digits noted bilateral.   DERMATOLOGIC: skin color, texture, and turgor are within normal limits.  No preulcerative lesions or ulcers  are seen, no interdigital maceration noted.  No open lesions present.  Digital nails are asymptomatic. No drainage noted.         Assessment & Plan:  Diabetes with neuropathy  IE  Performed. A diabetic foot exam and his feet are in great condition. Concerning his neuropathy. I informed him that I believe his medical doctor would be the doctor who prescribed gabapentin to help to relieve his intermittent neuropathy pain.  Told patient to return to the office in 6 months for continued diabetic foot exams.   Helane GuntherGregory Elita Dame DPM

## 2016-05-02 ENCOUNTER — Encounter: Payer: Self-pay | Admitting: Adult Health

## 2016-05-02 ENCOUNTER — Telehealth: Payer: Self-pay | Admitting: Adult Health

## 2016-05-02 NOTE — Telephone Encounter (Signed)
Please advise. Thanks.  

## 2016-05-02 NOTE — Telephone Encounter (Signed)
Pt would like to have a referral to the orthopaedics.   Pt would like to know to have more information on these medication Byetta, victoza and  bydureon in place of Trulicity.  Pt feels that he has done well on the Trulicity so if any of the others are better for him he would like to know.

## 2016-05-03 ENCOUNTER — Other Ambulatory Visit: Payer: Self-pay | Admitting: Adult Health

## 2016-05-03 DIAGNOSIS — M25561 Pain in right knee: Principal | ICD-10-CM

## 2016-05-03 DIAGNOSIS — G8929 Other chronic pain: Secondary | ICD-10-CM

## 2016-05-03 MED ORDER — DICLOFENAC SODIUM 75 MG PO TBEC: 75.0000 mg | DELAYED_RELEASE_TABLET | Freq: Two times a day (BID) | ORAL | 3 refills | Status: DC

## 2016-05-09 DIAGNOSIS — M1711 Unilateral primary osteoarthritis, right knee: Secondary | ICD-10-CM | POA: Diagnosis not present

## 2016-05-16 ENCOUNTER — Encounter: Payer: Self-pay | Admitting: Adult Health

## 2016-05-16 ENCOUNTER — Telehealth: Payer: Self-pay | Admitting: Adult Health

## 2016-05-16 DIAGNOSIS — G4700 Insomnia, unspecified: Secondary | ICD-10-CM

## 2016-05-19 ENCOUNTER — Encounter: Payer: Self-pay | Admitting: Adult Health

## 2016-05-20 ENCOUNTER — Other Ambulatory Visit: Payer: Self-pay | Admitting: Adult Health

## 2016-05-20 DIAGNOSIS — G4700 Insomnia, unspecified: Secondary | ICD-10-CM

## 2016-05-20 MED ORDER — ZOLPIDEM TARTRATE 5 MG PO TABS: 5.0000 mg | ORAL_TABLET | Freq: Every evening | ORAL | 0 refills | Status: DC | PRN

## 2016-05-21 ENCOUNTER — Ambulatory Visit (INDEPENDENT_AMBULATORY_CARE_PROVIDER_SITE_OTHER): Payer: PPO | Admitting: Family Medicine

## 2016-05-21 VITALS — BP 110/70 | HR 76 | Temp 98.0°F | Resp 16 | Ht 68.75 in | Wt 204.6 lb

## 2016-05-21 DIAGNOSIS — E785 Hyperlipidemia, unspecified: Secondary | ICD-10-CM

## 2016-05-21 DIAGNOSIS — M1000 Idiopathic gout, unspecified site: Secondary | ICD-10-CM

## 2016-05-21 DIAGNOSIS — E119 Type 2 diabetes mellitus without complications: Secondary | ICD-10-CM | POA: Diagnosis not present

## 2016-05-21 DIAGNOSIS — H40033 Anatomical narrow angle, bilateral: Secondary | ICD-10-CM | POA: Diagnosis not present

## 2016-05-21 MED ORDER — INDOMETHACIN 25 MG PO CAPS: 50.0000 mg | ORAL_CAPSULE | Freq: Three times a day (TID) | ORAL | Status: DC

## 2016-05-21 NOTE — Progress Notes (Signed)
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 His pain radiating up his forearm and he does have some discomfort and stiffness in his elbow on the ipsilateral side. He cannot completely extend the PIP joint of the pinky finger in question.  Patient takes insulin for his diabetes which is been under good control with Melissa D, metformin and gradually lowered dose of insulin (used to take 80 units a day and now takes 17 units per day). He had a cortisone shot this year for hip problem and his blood sugars went over 300.  Objective:  BP 110/70 (BP Location: Right Arm, Patient Position: Sitting, Cuff Size: Normal)   Pulse 76   Temp 98 F (36.7 C) (Oral)   Resp 16   Ht 5' 8.75" (1.746 m)   Wt 204 lb 9.6 oz (92.8 kg)   SpO2 97%   BMI 30.43 kg/m  Left pinky finger shows decreased extension and sees lacks (30)  with erythema over the PIP joint and subcutaneous nodule that is firm and non-fluctuant.    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  Plan: I discussed reducing his consumption of tea. I put him on Indocin. I gave him information about gout.  Acute idiopathic gout, unspecified site - Plan: indomethacin (INDOCIN) capsule 50 mg  Hyperlipidemia   Signed, Elvina SidleKurt Franziska Podgurski M.D.

## 2016-05-21 NOTE — Patient Instructions (Addendum)
   IF you received an x-ray today, you will receive an invoice from [facility]. Please contact [facility] at [phone] with questions or concerns regarding your invoice.   IF you received labwork today, you will receive an invoice from [facility]. Please contact [facility] at [phone] with questions or concerns regarding your invoice.   Our billing staff will not be able to assist you with questions regarding bills from these companies.  You will be contacted with the lab results as soon as they are available. The fastest way to get your results is to activate your My Chart account. Instructions are located on the last page of this paperwork. If you have not heard from us regarding the results in 2 weeks, please contact this office.     Gout Gout is an inflammatory arthritis caused by a buildup of uric acid crystals in the joints. Uric acid is a chemical that is normally present in the blood. When the level of uric acid in the blood is too high it can form crystals that deposit in your joints and tissues. This causes joint redness, soreness, and swelling (inflammation). Repeat attacks are common. Over time, uric acid crystals can form into masses (tophi) near a joint, destroying bone and causing disfigurement. Gout is treatable and often preventable. CAUSES  The disease begins with elevated levels of uric acid in the blood. Uric acid is produced by your body when it breaks down a naturally found substance called purines. Certain foods you eat, such as meats and fish, contain high amounts of purines. Causes of an elevated uric acid level include:  Being passed down from parent to child (heredity).  Diseases that cause increased uric acid production (such as obesity, psoriasis, and certain cancers).  Excessive alcohol use.  Diet, especially diets rich in meat and seafood.  Medicines, including certain cancer-fighting medicines  (chemotherapy), water pills (diuretics), and aspirin.  Chronic kidney disease. The kidneys are no longer able to remove uric acid well.  Problems with metabolism. Conditions strongly associated with gout include:  Obesity.  High blood pressure.  High cholesterol.  Diabetes. Not everyone with elevated uric acid levels gets gout. It is not understood why some people get gout and others do not. Surgery, joint injury, and eating too much of certain foods are some of the factors that can lead to gout attacks. SYMPTOMS   An attack of gout comes on quickly. It causes intense pain with redness, swelling, and warmth in a joint.  Fever can occur.  Often, only one joint is involved. Certain joints are more commonly involved:  Base of the big toe.  Knee.  Ankle.  Wrist.  Finger. Without treatment, an attack usually goes away in a few days to weeks. Between attacks, you usually will not have symptoms, which is different from many other forms of arthritis. DIAGNOSIS  Your caregiver will suspect gout based on your symptoms and exam. In some cases, tests may be recommended. The tests may include:  Blood tests.  Urine tests.  X-rays.  Joint fluid exam. This exam requires a needle to remove fluid from the joint (arthrocentesis). Using a microscope, gout is confirmed when uric acid crystals are seen in the joint fluid. TREATMENT  There are two phases to gout treatment: treating the sudden onset (acute) attack and preventing attacks (prophylaxis).  Treatment of an Acute Attack.  Medicines are used. These include anti-inflammatory medicines or steroid medicines.  An injection of steroid medicine into the affected   joint is sometimes necessary.  The painful joint is rested. Movement can worsen the arthritis.  You may use warm or cold treatments on painful joints, depending which works best for you.  Treatment to Prevent Attacks.  If you suffer from frequent gout attacks, your  caregiver may advise preventive medicine. These medicines are started after the acute attack subsides. These medicines either help your kidneys eliminate uric acid from your body or decrease your uric acid production. You may need to stay on these medicines for a very long time.  The early phase of treatment with preventive medicine can be associated with an increase in acute gout attacks. For this reason, during the first few months of treatment, your caregiver may also advise you to take medicines usually used for acute gout treatment. Be sure you understand your caregiver's directions. Your caregiver may make several adjustments to your medicine dose before these medicines are effective.  Discuss dietary treatment with your caregiver or dietitian. Alcohol and drinks high in sugar and fructose and foods such as meat, poultry, and seafood can increase uric acid levels. Your caregiver or dietitian can advise you on drinks and foods that should be limited. HOME CARE INSTRUCTIONS   Do not take aspirin to relieve pain. This raises uric acid levels.  Only take over-the-counter or prescription medicines for pain, discomfort, or fever as directed by your caregiver.  Rest the joint as much as possible. When in bed, keep sheets and blankets off painful areas.  Keep the affected joint raised (elevated).  Apply warm or cold treatments to painful joints. Use of warm or cold treatments depends on which works best for you.  Use crutches if the painful joint is in your leg.  Drink enough fluids to keep your urine clear or pale yellow. This helps your body get rid of uric acid. Limit alcohol, sugary drinks, and fructose drinks.  Follow your dietary instructions. Pay careful attention to the amount of protein you eat. Your daily diet should emphasize fruits, vegetables, whole grains, and fat-free or low-fat milk products. Discuss the use of coffee, vitamin C, and cherries with your caregiver or dietitian. These  may be helpful in lowering uric acid levels.  Maintain a healthy body weight. SEEK MEDICAL CARE IF:   You develop diarrhea, vomiting, or any side effects from medicines.  You do not feel better in 24 hours, or you are getting worse. SEEK IMMEDIATE MEDICAL CARE IF:   Your joint becomes suddenly more tender, and you have chills or a fever. MAKE SURE YOU:   Understand these instructions.  Will watch your condition.  Will get help right away if you are not doing well or get worse.   This information is not intended to replace advice given to you by your health care provider. Make sure you discuss any questions you have with your health care provider.   Document Released: 09/16/2000 Document Revised: 10/10/2014 Document Reviewed: 05/02/2012 Elsevier Interactive Patient Education 2016 Elsevier Inc.  

## 2016-05-24 ENCOUNTER — Encounter: Payer: Self-pay | Admitting: Adult Health

## 2016-05-24 NOTE — Telephone Encounter (Signed)
Does not need it filled. I gave it to him last week

## 2016-05-24 NOTE — Telephone Encounter (Signed)
Ok to refill? I was reviewing chart and it looks like patient had stated that he had weaned himself off of Ambien, but would still like to have some around.

## 2016-05-25 ENCOUNTER — Other Ambulatory Visit: Payer: Self-pay | Admitting: Adult Health

## 2016-05-25 DIAGNOSIS — Z713 Dietary counseling and surveillance: Secondary | ICD-10-CM

## 2016-05-30 ENCOUNTER — Other Ambulatory Visit (INDEPENDENT_AMBULATORY_CARE_PROVIDER_SITE_OTHER): Payer: PPO

## 2016-05-30 DIAGNOSIS — Z0000 Encounter for general adult medical examination without abnormal findings: Secondary | ICD-10-CM | POA: Diagnosis not present

## 2016-05-30 DIAGNOSIS — R7989 Other specified abnormal findings of blood chemistry: Secondary | ICD-10-CM | POA: Diagnosis not present

## 2016-05-30 LAB — HEPATIC FUNCTION PANEL
ALBUMIN: 4 g/dL (ref 3.5–5.2)
ALK PHOS: 43 U/L (ref 39–117)
ALT: 10 U/L (ref 0–53)
AST: 13 U/L (ref 0–37)
BILIRUBIN DIRECT: 0.1 mg/dL (ref 0.0–0.3)
Total Bilirubin: 0.5 mg/dL (ref 0.2–1.2)
Total Protein: 6.4 g/dL (ref 6.0–8.3)

## 2016-05-30 LAB — MICROALBUMIN / CREATININE URINE RATIO
CREATININE, U: 112.4 mg/dL
MICROALB UR: 3.2 mg/dL — AB (ref 0.0–1.9)
Microalb Creat Ratio: 2.8 mg/g (ref 0.0–30.0)

## 2016-05-30 LAB — POC URINALSYSI DIPSTICK (AUTOMATED)
Bilirubin, UA: NEGATIVE
GLUCOSE UA: NEGATIVE
Ketones, UA: NEGATIVE
Leukocytes, UA: NEGATIVE
NITRITE UA: NEGATIVE
PH UA: 5
Protein, UA: NEGATIVE
RBC UA: NEGATIVE
SPEC GRAV UA: 1.02
UROBILINOGEN UA: 0.2

## 2016-05-30 LAB — CBC WITH DIFFERENTIAL/PLATELET
BASOS ABS: 0 10*3/uL (ref 0.0–0.1)
Basophils Relative: 0.3 % (ref 0.0–3.0)
EOS ABS: 0.2 10*3/uL (ref 0.0–0.7)
Eosinophils Relative: 1.8 % (ref 0.0–5.0)
HEMATOCRIT: 39 % (ref 39.0–52.0)
Hemoglobin: 13.2 g/dL (ref 13.0–17.0)
LYMPHS ABS: 2 10*3/uL (ref 0.7–4.0)
LYMPHS PCT: 24.3 % (ref 12.0–46.0)
MCHC: 33.8 g/dL (ref 30.0–36.0)
MCV: 85.6 fl (ref 78.0–100.0)
MONOS PCT: 8.4 % (ref 3.0–12.0)
Monocytes Absolute: 0.7 10*3/uL (ref 0.1–1.0)
NEUTROS PCT: 65.2 % (ref 43.0–77.0)
Neutro Abs: 5.5 10*3/uL (ref 1.4–7.7)
PLATELETS: 299 10*3/uL (ref 150.0–400.0)
RBC: 4.55 Mil/uL (ref 4.22–5.81)
RDW: 13.7 % (ref 11.5–15.5)
WBC: 8.4 10*3/uL (ref 4.0–10.5)

## 2016-05-30 LAB — BASIC METABOLIC PANEL
BUN: 28 mg/dL — ABNORMAL HIGH (ref 6–23)
CALCIUM: 9.5 mg/dL (ref 8.4–10.5)
CO2: 23 mEq/L (ref 19–32)
CREATININE: 1.31 mg/dL (ref 0.40–1.50)
Chloride: 106 mEq/L (ref 96–112)
GFR: 58.19 mL/min — AB (ref >60.00)
Glucose, Bld: 115 mg/dL — ABNORMAL HIGH (ref 70–99)
Potassium: 4.9 mEq/L (ref 3.5–5.1)
SODIUM: 139 meq/L (ref 135–145)

## 2016-05-30 LAB — LIPID PANEL
Cholesterol: 140 mg/dL (ref 0–200)
HDL: 36.5 mg/dL — AB (ref >39.00)
NONHDL: 103.78
Total CHOL/HDL Ratio: 4
Triglycerides: 220 mg/dL — ABNORMAL HIGH (ref 0.0–149.0)
VLDL: 44 mg/dL — AB (ref 0.0–40.0)

## 2016-05-30 LAB — HEMOGLOBIN A1C: Hgb A1c MFr Bld: 6.4 % (ref 4.6–6.5)

## 2016-05-30 LAB — LDL CHOLESTEROL, DIRECT: LDL DIRECT: 72 mg/dL

## 2016-05-30 LAB — PSA: PSA: 17.94 ng/mL — AB (ref 0.10–4.00)

## 2016-05-30 LAB — TSH: TSH: 2.4 u[IU]/mL (ref 0.35–4.50)

## 2016-05-31 DIAGNOSIS — E1142 Type 2 diabetes mellitus with diabetic polyneuropathy: Secondary | ICD-10-CM | POA: Diagnosis not present

## 2016-05-31 DIAGNOSIS — Z794 Long term (current) use of insulin: Secondary | ICD-10-CM | POA: Diagnosis not present

## 2016-06-03 ENCOUNTER — Ambulatory Visit (INDEPENDENT_AMBULATORY_CARE_PROVIDER_SITE_OTHER): Payer: PPO | Admitting: Adult Health

## 2016-06-03 ENCOUNTER — Encounter: Payer: PPO | Admitting: Adult Health

## 2016-06-03 ENCOUNTER — Encounter: Payer: Self-pay | Admitting: Adult Health

## 2016-06-03 VITALS — BP 116/82 | Temp 97.9°F | Ht 68.75 in | Wt 194.4 lb

## 2016-06-03 DIAGNOSIS — M6740 Ganglion, unspecified site: Secondary | ICD-10-CM

## 2016-06-03 DIAGNOSIS — Z0000 Encounter for general adult medical examination without abnormal findings: Secondary | ICD-10-CM

## 2016-06-03 DIAGNOSIS — E785 Hyperlipidemia, unspecified: Secondary | ICD-10-CM | POA: Diagnosis not present

## 2016-06-03 DIAGNOSIS — E119 Type 2 diabetes mellitus without complications: Secondary | ICD-10-CM | POA: Diagnosis not present

## 2016-06-03 DIAGNOSIS — Z794 Long term (current) use of insulin: Secondary | ICD-10-CM | POA: Diagnosis not present

## 2016-06-03 NOTE — Addendum Note (Signed)
Addended by: Nancy FetterNAFZIGER, Analee Montee L on: 06/03/2016 01:24 PM   Modules accepted: Orders

## 2016-06-03 NOTE — Patient Instructions (Addendum)
It was great seeing you today!  You are making great strides to improve your health, please keep up the great work!   Your EKG was normal   Continue with diet and exercise  Follow up in 6 months Health Maintenance, Male A healthy lifestyle and preventative care can promote health and wellness.  Maintain regular health, dental, and eye exams.  Eat a healthy diet. Foods like vegetables, fruits, whole grains, low-fat dairy products, and lean protein foods contain the nutrients you need and are low in calories. Decrease your intake of foods high in solid fats, added sugars, and salt. Get information about a proper diet from your health care provider, if necessary.  Regular physical exercise is one of the most important things you can do for your health. Most adults should get at least 150 minutes of moderate-intensity exercise (any activity that increases your heart rate and causes you to sweat) each week. In addition, most adults need muscle-strengthening exercises on 2 or more days a week.   Maintain a healthy weight. The body mass index (BMI) is a screening tool to identify possible weight problems. It provides an estimate of body fat based on height and weight. Your health care provider can find your BMI and can help you achieve or maintain a healthy weight. For males 20 years and older:  A BMI below 18.5 is considered underweight.  A BMI of 18.5 to 24.9 is normal.  A BMI of 25 to 29.9 is considered overweight.  A BMI of 30 and above is considered obese.  Maintain normal blood lipids and cholesterol by exercising and minimizing your intake of saturated fat. Eat a balanced diet with plenty of fruits and vegetables. Blood tests for lipids and cholesterol should begin at age 66 and be repeated every 5 years. If your lipid or cholesterol levels are high, you are over age 66, or you are at high risk for heart disease, you may need your cholesterol levels checked more frequently.Ongoing high  lipid and cholesterol levels should be treated with medicines if diet and exercise are not working.  If you smoke, find out from your health care provider how to quit. If you do not use tobacco, do not start.  Lung cancer screening is recommended for adults aged 55-80 years who are at high risk for developing lung cancer because of a history of smoking. A yearly low-dose CT scan of the lungs is recommended for people who have at least a 30-pack-year history of smoking and are current smokers or have quit within the past 15 years. A pack year of smoking is smoking an average of 1 pack of cigarettes a day for 1 year (for example, a 30-pack-year history of smoking could mean smoking 1 pack a day for 30 years or 2 packs a day for 15 years). Yearly screening should continue until the smoker has stopped smoking for at least 15 years. Yearly screening should be stopped for people who develop a health problem that would prevent them from having lung cancer treatment.  If you choose to drink alcohol, do not have more than 2 drinks per day. One drink is considered to be 12 oz (360 mL) of beer, 5 oz (150 mL) of wine, or 1.5 oz (45 mL) of liquor.  Avoid the use of street drugs. Do not share needles with anyone. Ask for help if you need support or instructions about stopping the use of drugs.  High blood pressure causes heart disease and increases the risk  of stroke. High blood pressure is more likely to develop in:  People who have blood pressure in the end of the normal range (100-139/85-89 mm Hg).  People who are overweight or obese.  People who are African American.  If you are 36-50 years of age, have your blood pressure checked every 3-5 years. If you are 55 years of age or older, have your blood pressure checked every year. You should have your blood pressure measured twice--once when you are at a hospital or clinic, and once when you are not at a hospital or clinic. Record the average of the two  measurements. To check your blood pressure when you are not at a hospital or clinic, you can use:  An automated blood pressure machine at a pharmacy.  A home blood pressure monitor.  If you are 77-12 years old, ask your health care provider if you should take aspirin to prevent heart disease.  Diabetes screening involves taking a blood sample to check your fasting blood sugar level. This should be done once every 3 years after age 66 if you are at a normal weight and without risk factors for diabetes. Testing should be considered at a younger age or be carried out more frequently if you are overweight and have at least 1 risk factor for diabetes.  Colorectal cancer can be detected and often prevented. Most routine colorectal cancer screening begins at the age of 64 and continues through age 32. However, your health care provider may recommend screening at an earlier age if you have risk factors for colon cancer. On a yearly basis, your health care provider may provide home test kits to check for hidden blood in the stool. A small camera at the end of a tube may be used to directly examine the colon (sigmoidoscopy or colonoscopy) to detect the earliest forms of colorectal cancer. Talk to your health care provider about this at age 15 when routine screening begins. A direct exam of the colon should be repeated every 5-10 years through age 8, unless early forms of precancerous polyps or small growths are found.  People who are at an increased risk for hepatitis B should be screened for this virus. You are considered at high risk for hepatitis B if:  You were born in a country where hepatitis B occurs often. Talk with your health care provider about which countries are considered high risk.  Your parents were born in a high-risk country and you have not received a shot to protect against hepatitis B (hepatitis B vaccine).  You have HIV or AIDS.  You use needles to inject street drugs.  You live  with, or have sex with, someone who has hepatitis B.  You are a man who has sex with other men (MSM).  You get hemodialysis treatment.  You take certain medicines for conditions like cancer, organ transplantation, and autoimmune conditions.  Hepatitis C blood testing is recommended for all people born from [birth year] through 1966 and any individual with known risk factors for hepatitis C.  Healthy men should no longer receive prostate-specific antigen (PSA) blood tests as part of routine cancer screening. Talk to your health care provider about prostate cancer screening.  Testicular cancer screening is not recommended for adolescents or adult males who have no symptoms. Screening includes self-exam, a health care provider exam, and other screening tests. Consult with your health care provider about any symptoms you have or any concerns you have about testicular cancer.  Practice safe sex.  Use condoms and avoid high-risk sexual practices to reduce the spread of sexually transmitted infections (STIs).  You should be screened for STIs, including gonorrhea and chlamydia if:  You are sexually active and are younger than 24 years.  You are older than 24 years, and your health care provider tells you that you are at risk for this type of infection.  Your sexual activity has changed since you were last screened, and you are at an increased risk for chlamydia or gonorrhea. Ask your health care provider if you are at risk.  If you are at risk of being infected with HIV, it is recommended that you take a prescription medicine daily to prevent HIV infection. This is called pre-exposure prophylaxis (PrEP). You are considered at risk if:  You are a man who has sex with other men (MSM).  You are a heterosexual man who is sexually active with multiple partners.  You take drugs by injection.  You are sexually active with a partner who has HIV.  Talk with your health care provider about whether you are at  high risk of being infected with HIV. If you choose to begin PrEP, you should first be tested for HIV. You should then be tested every 3 months for as long as you are taking PrEP.  Use sunscreen. Apply sunscreen liberally and repeatedly throughout the day. You should seek shade when your shadow is shorter than you. Protect yourself by wearing long sleeves, pants, a wide-brimmed hat, and sunglasses year round whenever you are outdoors.  Tell your health care provider of new moles or changes in moles, especially if there is a change in shape or color. Also, tell your health care provider if a mole is larger than the size of a pencil eraser.  A one-time screening for abdominal aortic aneurysm (AAA) and surgical repair of large AAAs by ultrasound is recommended for men aged 16-75 years who are current or former smokers.  Stay current with your vaccines (immunizations).   This information is not intended to replace advice given to you by your health care provider. Make sure you discuss any questions you have with your health care provider.   Document Released: 03/17/2008 Document Revised: 10/10/2014 Document Reviewed: 02/14/2011 Elsevier Interactive Patient Education [employer].

## 2016-06-03 NOTE — Progress Notes (Addendum)
Subjective:    Patient ID: Bernard Roberts, male    DOB: [birth year], 65 y.o.   MRN: [id number]  HPI  Patient presents for yearly preventative medicine examination. He is pleasant 64 year old male who  has a past medical history of Diabetes mellitus without complication (HCC); Elevated PSA; Gout; Hyperlipidemia; and Hypertension.   All immunizations and health maintenance protocols were reviewed with the patient and needed orders were placed.  Medication reconciliation,  past medical history, social history, problem list and allergies were reviewed in detail with the patient  Goals were established with regard to weight loss, exercise, and  diet in compliance with medications. He continues with the ketogenic diet and is exercising. He has lost about 10 pounds in the last month.   End of life planning was discussed - he does not have a living will or HCPOA. His wife is a Child psychotherapist and will get the paper work for him.   He is up to date on his colonoscopy, dental and eye exams.   He has un upcoming urology appointment to discuss his elevated PSA ( he has a history of elevation) as well as to discuss decreased urinary stream.   His only acute concern is that of a cyst on his left pinkie finger in the PIP joint. He does have some discomfort with ROM. No redness or warmth reported.    Review of Systems  Constitutional: Negative.   HENT: Negative.   Eyes: Negative.   Respiratory: Negative.   Cardiovascular: Negative.   Gastrointestinal: Negative.   Endocrine: Negative.   Genitourinary: Positive for decreased urine volume and difficulty urinating.       Decreased stream  Musculoskeletal: Negative.   Skin: Negative.   Allergic/Immunologic: Negative.   Neurological: Negative.   Hematological: Negative.   Psychiatric/Behavioral: Negative.   All other systems reviewed and are negative.  Past Medical History:  Diagnosis Date  . Diabetes mellitus without complication (HCC)   .  Elevated PSA    Around 14   . Gout   . Hyperlipidemia   . Hypertension     Social History   Social History  . Marital status: Married    Spouse name: N/A  . Number of children: N/A  . Years of education: N/A   Occupational History  . Not on file.   Social History Main Topics  . Smoking status: Never Smoker  . Smokeless tobacco: Not on file  . Alcohol use 0.0 oz/week     Comment: 3-4 week  . Drug use: No  . Sexual activity: Not on file   Other Topics Concern  . Not on file   Social History Narrative   Works for as  [employer]    He likes to swim, write, hike.     Past Surgical History:  Procedure Laterality Date  . KNEE ARTHROPLASTY     unknown which side    Family History  Problem Relation Age of Onset  . Diabetes Father   . Heart disease Father   . Hypertension Father   . Stroke Father   . Diabetes Sister   . Mental illness Sister     Parnoid   . Alcohol abuse Brother   . Hypertension Brother     No Known Allergies  Current Outpatient Prescriptions on File Prior to Visit  Medication Sig Dispense Refill  . atorvastatin (LIPITOR) 10 MG tablet Take 10 mg by mouth daily.    . diclofenac (VOLTAREN) 75 MG EC tablet  Take 1 tablet (75 mg total) by mouth 2 (two) times daily. 60 tablet 3  . losartan (COZAAR) 100 MG tablet Take 100 mg by mouth daily.    Marland Kitchen METFORMIN HCL PO Take by mouth.    . zolpidem (AMBIEN) 5 MG tablet Take 1 tablet (5 mg total) by mouth at bedtime as needed for sleep. 30 tablet 0   Current Facility-Administered Medications on File Prior to Visit  Medication Dose Route Frequency Provider Last Rate Last Dose  . indomethacin (INDOCIN) capsule 50 mg  50 mg Oral TID WC Elvina Sidle, MD        BP 116/82   Temp 97.9 F (36.6 C) (Oral)   Ht 5' 8.75" (1.746 m)   Wt 194 lb 6.4 oz (88.2 kg)   BMI 28.92 kg/m       Objective:   Physical Exam  Constitutional: He is oriented to person, place, and time. He appears well-developed and  well-nourished. No distress.  obese  HENT:  Head: Normocephalic and atraumatic.  Right Ear: External ear normal.  Left Ear: External ear normal.  Nose: Nose normal.  Mouth/Throat: Oropharynx is clear and moist. No oropharyngeal exudate.  Eyes: Conjunctivae and EOM are normal. Pupils are equal, round, and reactive to light. Right eye exhibits no discharge. Left eye exhibits no discharge. No scleral icterus.  Neck: Trachea normal and normal range of motion. Neck supple. No JVD present. Carotid bruit is not present. No tracheal deviation present. No thyromegaly present.  Cardiovascular: Normal rate, regular rhythm, normal heart sounds and intact distal pulses.  Exam reveals no gallop and no friction rub.   No murmur heard. Pulmonary/Chest: Effort normal and breath sounds normal. No stridor. No respiratory distress. He has no decreased breath sounds. He has no wheezes. He has no rhonchi. He has no rales. He exhibits no tenderness.  Abdominal: Soft. Normal appearance and bowel sounds are normal. He exhibits no distension and no mass. There is no hepatosplenomegaly, splenomegaly or hepatomegaly. There is no tenderness. There is no rebound, no guarding and no CVA tenderness. No hernia.  Genitourinary: Rectum normal. Rectal exam shows no external hemorrhoid, no internal hemorrhoid, no fissure, no mass, no tenderness, anal tone normal and guaiac negative stool. Prostate is enlarged. Prostate is not tender. No penile tenderness.  Musculoskeletal: Normal range of motion. He exhibits deformity (small ganglion cyst on left pinkie finger). He exhibits no edema or tenderness.  Lymphadenopathy:    He has no cervical adenopathy.  Neurological: He is alert and oriented to person, place, and time. He has normal reflexes. He displays normal reflexes. No cranial nerve deficit. He exhibits normal muscle tone. Coordination normal.  Skin: Skin is warm and dry. No rash noted. He is not diaphoretic. No erythema. No pallor.   Psychiatric: He has a normal mood and affect. His behavior is normal. Judgment and thought content normal.  Nursing note and vitals reviewed.     Assessment & Plan:  1. Routine general medical examination at a health care facility - Reviewed labs in detail with patient. All questions answered - He is doing the Ketogenic and is following up with a physician at [facility] that specializes in this diet - EKG 12-Lead- RN, left anterior fascicular block. Rate 82.  - Encouraged increasing exercise intensity and duration  - Follow up in one year for next CPE  2. Controlled type 2 diabetes mellitus without complication, with long-term current use of insulin (HCC) - Controlled on Metformin and Trulicity. A1c 6.4  -  EKG 12-Lead - Follow up in 6 months   3. Hyperlipidemia - Has improved - No change in medication at this time - EKG 12-Lead  4. Ganglion cyst - Ambulatory referral to Hand Surgery   Shirline Freesory Candace Ramus, NP\

## 2016-06-07 DIAGNOSIS — E1142 Type 2 diabetes mellitus with diabetic polyneuropathy: Secondary | ICD-10-CM | POA: Diagnosis not present

## 2016-06-07 DIAGNOSIS — Z794 Long term (current) use of insulin: Secondary | ICD-10-CM | POA: Diagnosis not present

## 2016-06-08 DIAGNOSIS — M24542 Contracture, left hand: Secondary | ICD-10-CM | POA: Diagnosis not present

## 2016-06-08 DIAGNOSIS — R2232 Localized swelling, mass and lump, left upper limb: Secondary | ICD-10-CM | POA: Diagnosis not present

## 2016-06-08 DIAGNOSIS — M1A0421 Idiopathic chronic gout, left hand, with tophus (tophi): Secondary | ICD-10-CM | POA: Diagnosis not present

## 2016-06-15 ENCOUNTER — Other Ambulatory Visit: Payer: Self-pay | Admitting: Adult Health

## 2016-06-15 ENCOUNTER — Encounter: Payer: Self-pay | Admitting: Adult Health

## 2016-06-15 DIAGNOSIS — G4700 Insomnia, unspecified: Secondary | ICD-10-CM

## 2016-06-16 MED ORDER — ZOLPIDEM TARTRATE 5 MG PO TABS: 5.0000 mg | ORAL_TABLET | Freq: Every evening | ORAL | 0 refills | Status: DC | PRN

## 2016-06-16 NOTE — Telephone Encounter (Signed)
Rx called in as directed.   

## 2016-07-05 DIAGNOSIS — R972 Elevated prostate specific antigen [PSA]: Secondary | ICD-10-CM | POA: Diagnosis not present

## 2016-07-05 DIAGNOSIS — E291 Testicular hypofunction: Secondary | ICD-10-CM | POA: Diagnosis not present

## 2016-07-05 DIAGNOSIS — N5201 Erectile dysfunction due to arterial insufficiency: Secondary | ICD-10-CM | POA: Diagnosis not present

## 2016-07-06 ENCOUNTER — Other Ambulatory Visit (HOSPITAL_COMMUNITY): Payer: Self-pay | Admitting: Urology

## 2016-07-06 DIAGNOSIS — R2232 Localized swelling, mass and lump, left upper limb: Secondary | ICD-10-CM | POA: Diagnosis not present

## 2016-07-06 DIAGNOSIS — R972 Elevated prostate specific antigen [PSA]: Secondary | ICD-10-CM

## 2016-07-13 ENCOUNTER — Other Ambulatory Visit: Payer: Self-pay | Admitting: Adult Health

## 2016-07-13 DIAGNOSIS — G4700 Insomnia, unspecified: Secondary | ICD-10-CM

## 2016-07-19 ENCOUNTER — Other Ambulatory Visit: Payer: Self-pay

## 2016-07-19 MED ORDER — ZOLPIDEM TARTRATE 5 MG PO TABS: 5.0000 mg | ORAL_TABLET | Freq: Every evening | ORAL | 0 refills | Status: DC | PRN

## 2016-07-19 NOTE — Telephone Encounter (Signed)
Last refill 06/16/16 #30, last OV 06/03/16. Ok to refill?

## 2016-07-19 NOTE — Telephone Encounter (Signed)
Rx called in as directed.   

## 2016-07-19 NOTE — Telephone Encounter (Signed)
Ok to refill 

## 2016-07-20 ENCOUNTER — Ambulatory Visit (HOSPITAL_COMMUNITY)
Admission: RE | Admit: 2016-07-20 | Discharge: 2016-07-20 | Disposition: A | Discharge status: Home / Self Care | Payer: PPO | Source: Ambulatory Visit | Attending: Urology | Admitting: Urology

## 2016-07-20 DIAGNOSIS — R972 Elevated prostate specific antigen [PSA]: Secondary | ICD-10-CM | POA: Insufficient documentation

## 2016-07-20 DIAGNOSIS — N400 Enlarged prostate without lower urinary tract symptoms: Secondary | ICD-10-CM | POA: Insufficient documentation

## 2016-07-20 DIAGNOSIS — F40240 Claustrophobia: Secondary | ICD-10-CM | POA: Diagnosis not present

## 2016-07-20 LAB — POCT I-STAT CREATININE: Creatinine, Ser: 1.2 mg/dL (ref 0.61–1.24)

## 2016-07-20 MED ORDER — GADOBENATE DIMEGLUMINE 529 MG/ML IV SOLN
20.0000 mL | Freq: Once | INTRAVENOUS | Status: AC | PRN
  Administered 2016-07-20: 18 mL via INTRAVENOUS

## 2016-07-27 DIAGNOSIS — E291 Testicular hypofunction: Secondary | ICD-10-CM | POA: Diagnosis not present

## 2016-07-27 DIAGNOSIS — R972 Elevated prostate specific antigen [PSA]: Secondary | ICD-10-CM | POA: Diagnosis not present

## 2016-07-27 DIAGNOSIS — N5201 Erectile dysfunction due to arterial insufficiency: Secondary | ICD-10-CM | POA: Diagnosis not present

## 2016-08-03 DIAGNOSIS — E291 Testicular hypofunction: Secondary | ICD-10-CM | POA: Diagnosis not present

## 2016-08-15 ENCOUNTER — Other Ambulatory Visit: Payer: Self-pay | Admitting: Adult Health

## 2016-08-15 ENCOUNTER — Encounter: Payer: Self-pay | Admitting: Adult Health

## 2016-09-13 ENCOUNTER — Other Ambulatory Visit: Payer: Self-pay | Admitting: Adult Health

## 2016-09-13 ENCOUNTER — Encounter: Payer: Self-pay | Admitting: Adult Health

## 2016-09-14 MED ORDER — ZOLPIDEM TARTRATE 5 MG PO TABS: 5.0000 mg | ORAL_TABLET | Freq: Every evening | ORAL | 0 refills | Status: DC | PRN

## 2016-10-13 ENCOUNTER — Other Ambulatory Visit: Payer: Self-pay | Admitting: Adult Health

## 2016-10-14 MED ORDER — ZOLPIDEM TARTRATE 5 MG PO TABS: 5.0000 mg | ORAL_TABLET | Freq: Every evening | ORAL | 0 refills | Status: DC | PRN

## 2016-10-14 NOTE — Telephone Encounter (Signed)
Patient notified and rx refilled.

## 2016-10-14 NOTE — Telephone Encounter (Signed)
Ok to refill 

## 2016-10-15 ENCOUNTER — Other Ambulatory Visit: Payer: Self-pay | Admitting: Adult Health

## 2016-10-17 ENCOUNTER — Other Ambulatory Visit: Payer: Self-pay | Admitting: Adult Health

## 2016-10-17 ENCOUNTER — Encounter: Payer: Self-pay | Admitting: Adult Health

## 2016-10-18 NOTE — Telephone Encounter (Signed)
Rx has been called in as directed.  

## 2016-10-18 NOTE — Telephone Encounter (Signed)
Ok to refill for 30 days  

## 2016-11-04 ENCOUNTER — Encounter: Payer: Self-pay | Admitting: Adult Health

## 2016-11-07 ENCOUNTER — Encounter: Payer: Self-pay | Admitting: Adult Health

## 2016-11-08 ENCOUNTER — Other Ambulatory Visit: Payer: Self-pay | Admitting: Adult Health

## 2016-11-08 MED ORDER — DULAGLUTIDE 1.5 MG/0.5ML ~~LOC~~ SOAJ: 1.5000 mg | SUBCUTANEOUS | 11 refills | Status: DC

## 2016-11-10 ENCOUNTER — Encounter: Payer: Self-pay | Admitting: Adult Health

## 2016-11-15 ENCOUNTER — Other Ambulatory Visit: Payer: Self-pay | Admitting: Adult Health

## 2016-11-16 ENCOUNTER — Encounter: Payer: Self-pay | Admitting: Adult Health

## 2016-11-16 ENCOUNTER — Other Ambulatory Visit: Payer: Self-pay

## 2016-11-16 MED ORDER — ZOLPIDEM TARTRATE 5 MG PO TABS: ORAL_TABLET | ORAL | 0 refills | Status: DC

## 2016-11-17 ENCOUNTER — Telehealth: Payer: Self-pay | Admitting: Adult Health

## 2016-11-17 NOTE — Telephone Encounter (Signed)
I spoke with Judeth CornfieldStephanie in the lab, and she states she has put order in for Hep C screening. Thanks!

## 2016-11-17 NOTE — Telephone Encounter (Signed)
I have scheduled pt for all the vaccines except the shingles.  Pt is going to wait until we get the shingrix in.  Pt also states he wanted a hep c screening.  I scheduled the pt. Can you put the order in?  Pt is coming in tomorrow, 11/18/2016

## 2016-11-17 NOTE — Telephone Encounter (Signed)
Pt has been scheduled.  Will wait on the shingles

## 2016-11-18 ENCOUNTER — Other Ambulatory Visit (INDEPENDENT_AMBULATORY_CARE_PROVIDER_SITE_OTHER): Payer: PPO

## 2016-11-18 ENCOUNTER — Ambulatory Visit (INDEPENDENT_AMBULATORY_CARE_PROVIDER_SITE_OTHER): Payer: PPO

## 2016-11-18 DIAGNOSIS — Z23 Encounter for immunization: Secondary | ICD-10-CM

## 2016-11-18 DIAGNOSIS — Z1159 Encounter for screening for other viral diseases: Secondary | ICD-10-CM | POA: Diagnosis not present

## 2016-11-18 NOTE — Progress Notes (Signed)
Patient received his Flu, PNA-13, TDAP immunizations  on 11/18/2016 at 3pm. Given by Mendel CorningNancy Jkayla Spiewak CMA.

## 2016-11-19 LAB — HEPATITIS C ANTIBODY: HCV AB: NEGATIVE

## 2016-11-23 ENCOUNTER — Encounter: Payer: Self-pay | Admitting: Adult Health

## 2016-11-29 ENCOUNTER — Ambulatory Visit (INDEPENDENT_AMBULATORY_CARE_PROVIDER_SITE_OTHER): Payer: PPO | Admitting: Adult Health

## 2016-11-29 VITALS — BP 128/64 | Temp 98.6°F | Ht 68.75 in | Wt 204.3 lb

## 2016-11-29 DIAGNOSIS — Z7189 Other specified counseling: Secondary | ICD-10-CM | POA: Diagnosis not present

## 2016-11-29 NOTE — Progress Notes (Signed)
Subjective:    Patient ID: Bernard Roberts, male    DOB: January 06, 1951, 66 y.o.   MRN: [id number]  HPI  66 year old male who presents to the office today to be shown how to give himself prescribed testosterone injection.   Review of Systems Past Medical History:  Diagnosis Date  . Diabetes mellitus without complication (HCC)   . Elevated PSA    Around 15   . Gout   . Hyperlipidemia   . Hypertension     Social History   Social History  . Marital status: Married    Spouse name: N/A  . Number of children: N/A  . Years of education: N/A   Occupational History  . Not on file.   Social History Main Topics  . Smoking status: Never Smoker  . Smokeless tobacco: Not on file  . Alcohol use 0.0 oz/week     Comment: 3-4 week  . Drug use: No  . Sexual activity: Not on file   Other Topics Concern  . Not on file   Social History Narrative   Works for as  [employer]    He likes to swim, write, hike.     Past Surgical History:  Procedure Laterality Date  . KNEE ARTHROPLASTY     unknown which side    Family History  Problem Relation Age of Onset  . Diabetes Father   . Heart disease Father   . Hypertension Father   . Stroke Father   . Diabetes Sister   . Mental illness Sister     Parnoid   . Alcohol abuse Brother   . Hypertension Brother     No Known Allergies  Current Outpatient Prescriptions on File Prior to Visit  Medication Sig Dispense Refill  . atorvastatin (LIPITOR) 10 MG tablet Take 10 mg by mouth daily.    . diclofenac (VOLTAREN) 75 MG EC tablet Take 1 tablet (75 mg total) by mouth 2 (two) times daily. 60 tablet 3  . Dulaglutide (TRULICITY) 1.5 MG/0.5ML SOPN Inject 1.5 mg into the skin once a week. 4 pen 11  . losartan (COZAAR) 100 MG tablet Take 100 mg by mouth daily.    Marland Kitchen. METFORMIN HCL PO Take by mouth.    . zolpidem (AMBIEN) 5 MG tablet TAKE ONE TABLET BY MOUTH EVERY NIGHT AT BEDTIME AS NEEDED FOR SLEEP 30 tablet 0   Current Facility-Administered  Medications on File Prior to Visit  Medication Dose Route Frequency Provider Last Rate Last Dose  . indomethacin (INDOCIN) capsule 50 mg  50 mg Oral TID WC Elvina SidleKurt Lauenstein, MD        BP 128/64   Temp 98.6 F (37 C) (Oral)   Ht 5' 8.75" (1.746 m)   Wt 204 lb 4.8 oz (92.7 kg)   BMI 30.39 kg/m       Objective:   Physical Exam  Constitutional: He is oriented to person, place, and time. He appears well-developed and well-nourished. No distress.  Neurological: He is alert and oriented to person, place, and time.  Skin: Skin is warm and dry. No rash noted. He is not diaphoretic. No erythema. No pallor.  Psychiatric: He has a normal mood and affect. His behavior is normal. Judgment and thought content normal.  Nursing note and vitals reviewed.     Assessment & Plan:  1. Encounter for injection education - He was able to demonstrate correct procedure for injectioning testosterone into left thigh  - Follow up as needed  [facility] Hill[facility]  Tayt Moyers, NP

## 2016-12-05 ENCOUNTER — Encounter: Payer: Self-pay | Admitting: Adult Health

## 2016-12-06 ENCOUNTER — Other Ambulatory Visit: Payer: Self-pay

## 2016-12-06 MED ORDER — METFORMIN HCL ER (OSM) 500 MG PO TB24: ORAL_TABLET | ORAL | 3 refills | Status: DC

## 2016-12-09 ENCOUNTER — Telehealth: Payer: Self-pay

## 2016-12-09 NOTE — Telephone Encounter (Signed)
PA approved, form faxed back to pharmacy. 

## 2016-12-09 NOTE — Telephone Encounter (Signed)
Received PA request from [facility] for foramet 500 mg tablets. PA submitted & is pending.Key: [id number]

## 2016-12-19 ENCOUNTER — Encounter: Payer: Self-pay | Admitting: Adult Health

## 2016-12-19 ENCOUNTER — Other Ambulatory Visit: Payer: Self-pay | Admitting: Adult Health

## 2016-12-20 NOTE — Telephone Encounter (Signed)
Ok to refill 

## 2016-12-20 NOTE — Telephone Encounter (Signed)
Ok to refill for one month  

## 2016-12-20 NOTE — Telephone Encounter (Signed)
Rx called in as directed.   

## 2017-01-05 ENCOUNTER — Telehealth: Payer: Self-pay

## 2017-01-05 NOTE — Telephone Encounter (Signed)
I received a call from [facility] pharmacy and they state that they are doing some medication management screenings.  The pharmacist states that the patient is on diabetic medications, but is not on a statin.  She states that it looks like the last refill on Lipitor was rx'ed by Dr. Donald Siva on 12/02/15 for #30 with 3 refills, but the refills were never called in or picked up. Pharmacist would like to know If the pcp discontinued this medication, or if it needs to be reviewed so patient can start back on this medication.  I do not see in patient's chart where anything has been documented about a discontinuation for this medication. Kandee Keen, could you advise?

## 2017-01-05 NOTE — Telephone Encounter (Signed)
He does have lipitor in his chart. From his last lipid panel, he should be taking this medication. It does look like Dr. Ruthann Cancer was the one who prescribed it last. I would be happy to call this in, if the patient still wants to take it.

## 2017-01-05 NOTE — Telephone Encounter (Signed)
Patient states he will call back at the end of the day to schedule an office visit. He states he would like to hold off on this medication for now, until he has blood work - to make sure that he still needs this medication. I will route to [facility] [employer]

## 2017-01-17 ENCOUNTER — Other Ambulatory Visit: Payer: Self-pay | Admitting: Adult Health

## 2017-01-18 MED ORDER — ZOLPIDEM TARTRATE 5 MG PO TABS: 5.0000 mg | ORAL_TABLET | Freq: Every evening | ORAL | 0 refills | Status: DC | PRN

## 2017-02-01 ENCOUNTER — Other Ambulatory Visit: Payer: Self-pay | Admitting: Adult Health

## 2017-02-02 NOTE — Telephone Encounter (Signed)
Ok to refill for 6 months 

## 2017-02-13 ENCOUNTER — Encounter: Payer: Self-pay | Admitting: Adult Health

## 2017-02-20 ENCOUNTER — Other Ambulatory Visit: Payer: Self-pay

## 2017-02-20 MED ORDER — ZOLPIDEM TARTRATE 5 MG PO TABS: 5.0000 mg | ORAL_TABLET | Freq: Every evening | ORAL | 0 refills | Status: DC | PRN

## 2017-03-28 ENCOUNTER — Other Ambulatory Visit: Payer: Self-pay | Admitting: Adult Health

## 2017-03-29 MED ORDER — ZOLPIDEM TARTRATE 5 MG PO TABS: 5.0000 mg | ORAL_TABLET | Freq: Every evening | ORAL | 0 refills | Status: DC | PRN

## 2017-04-06 ENCOUNTER — Ambulatory Visit (HOSPITAL_COMMUNITY)
Admission: EM | Admit: 2017-04-06 | Discharge: 2017-04-06 | Disposition: A | Discharge status: Home / Self Care | Payer: PPO | Attending: Family Medicine | Admitting: Family Medicine

## 2017-04-06 ENCOUNTER — Encounter (HOSPITAL_COMMUNITY): Payer: Self-pay | Admitting: Emergency Medicine

## 2017-04-06 DIAGNOSIS — L03116 Cellulitis of left lower limb: Secondary | ICD-10-CM | POA: Diagnosis not present

## 2017-04-06 MED ORDER — SULFAMETHOXAZOLE-TRIMETHOPRIM 800-160 MG PO TABS: 1.0000 | ORAL_TABLET | Freq: Two times a day (BID) | ORAL | 0 refills | Status: AC

## 2017-04-06 NOTE — ED Provider Notes (Signed)
CSN: [id number]     Arrival date & time 04/06/17  1013 History   None    Chief Complaint  Patient presents with  . Rash   (Consider location/radiation/quality/duration/timing/severity/associated sxs/prior Treatment) Bernard Roberts is a 66 y.o. male with past hx of HTN, high cholesterol, DM, and gout who presents to the [facility] urgent care with a chief complaint of left leg swelling, pain, and warmth for 2 days. He denies injury but has had mosquito bites. Denies recent travel, periods of immobilization, hospitalization, smoking, hx of clotting disorders, or other risk factors for PE/DVT. Has no fever, chills, N/V/D, or other symptoms.    The history is provided by the patient.  Rash    Past Medical History:  Diagnosis Date  . Diabetes mellitus without complication (HCC)   . Elevated PSA    Around 15   . Gout   . Hyperlipidemia   . Hypertension    Past Surgical History:  Procedure Laterality Date  . KNEE ARTHROPLASTY     unknown which side   Family History  Problem Relation Age of Onset  . Diabetes Father   . Heart disease Father   . Hypertension Father   . Stroke Father   . Diabetes Sister   . Mental illness Sister        Parnoid   . Alcohol abuse Brother   . Hypertension Brother    Social History  Substance Use Topics  . Smoking status: Never Smoker  . Smokeless tobacco: Not on file  . Alcohol use 0.0 oz/week     Comment: 3-4 week    Review of Systems  Constitutional: Negative.   HENT: Negative.   Respiratory: Negative.   Cardiovascular: Negative.   Gastrointestinal: Negative.   Musculoskeletal: Negative.   Skin: Positive for rash.    Allergies  Patient has no known allergies.  Home Medications   Prior to Admission medications   Medication Sig Start Date End Date Taking? Authorizing Provider  diclofenac (VOLTAREN) 75 MG EC tablet TAKE ONE TABLET BY MOUTH TWICE A DAY 02/02/17   Nafziger, Kandee Keen, NP  Dulaglutide (TRULICITY) 1.5 MG/0.5ML SOPN Inject  1.5 mg into the skin once a week. 11/08/16   Nafziger, Kandee Keen, NP  losartan (COZAAR) 100 MG tablet Take 100 mg by mouth daily.    [provider]  metformin (FORTAMET) 500 MG (OSM) 24 hr tablet Take 2 tablets by mouth every morning and 3 tablets every evening. 12/06/16   Nafziger, Kandee Keen, NP  sulfamethoxazole-trimethoprim (BACTRIM DS,SEPTRA DS) 800-160 MG tablet Take 1 tablet by mouth 2 (two) times daily. 04/06/17 04/13/17  Dorena Bodo, NP  zolpidem (AMBIEN) 5 MG tablet Take 1 tablet (5 mg total) by mouth at bedtime as needed. 03/29/17   Shirline Frees, NP   Meds Ordered and Administered this Visit  Medications - No data to display  BP 111/65 (BP Location: Right Arm)   Pulse 79   Temp 98.1 F (36.7 C) (Oral)   Resp 18   SpO2 97%  No data found.   Physical Exam  Constitutional: He is oriented to person, place, and time. He appears well-developed and well-nourished. No distress.  HENT:  Head: Normocephalic and atraumatic.  Right Ear: External ear normal.  Left Ear: External ear normal.  Eyes: Conjunctivae are normal.  Neck: Normal range of motion.  Musculoskeletal: He exhibits edema and tenderness.       Legs: Neurological: He is alert and oriented to person, place, and time.  Skin:  Skin is warm and dry. Capillary refill takes less than 2 seconds. He is not diaphoretic.  Psychiatric: He has a normal mood and affect. His behavior is normal.  Nursing note and vitals reviewed.   Urgent Care Course     Procedures (including critical care time)  Labs Review Labs Reviewed - No data to display  Imaging Review No results found.      MDM   1. Cellulitis of left lower extremity    Symptoms consistent with cellulitis, starting on Bactrim, follow up with PCP in one week or return to clinic as needed.    Dorena BodoKennard, Naira Standiford, NP 04/06/17 1243

## 2017-04-06 NOTE — ED Triage Notes (Signed)
Left lower leg swelling, redness, mild pain, warmth to the area.  , swelling in foot as well.  No known injury.  Reports scratching is from mosquito bites.

## 2017-04-06 NOTE — Discharge Instructions (Signed)
Your symptoms are consistent with cellulitis. We have prescribed an antibiotic called Bactrim, take one tablet twice a day for 7 days. This antibiotic covers for staph, which is a common skin infection. If your symptoms worsen, or if they fail to resolve, follow up with your primary care provider, or return to clinic as needed, as sometimes a change in therapy may be needed. If at any time he have worsening symptoms such as development of fever, chills, nausea, vomiting, weakness, etc., consider the emergency room.

## 2017-05-01 ENCOUNTER — Other Ambulatory Visit: Payer: Self-pay | Admitting: Adult Health

## 2017-05-02 MED ORDER — ZOLPIDEM TARTRATE 5 MG PO TABS: 5.0000 mg | ORAL_TABLET | Freq: Every evening | ORAL | 0 refills | Status: DC | PRN

## 2017-05-02 NOTE — Telephone Encounter (Signed)
Called to the pharmacy and left on machine. 

## 2017-05-23 ENCOUNTER — Other Ambulatory Visit: Payer: Self-pay | Admitting: Family Medicine

## 2017-05-23 ENCOUNTER — Encounter: Payer: Self-pay | Admitting: Adult Health

## 2017-05-23 DIAGNOSIS — E119 Type 2 diabetes mellitus without complications: Secondary | ICD-10-CM

## 2017-05-23 DIAGNOSIS — Z794 Long term (current) use of insulin: Principal | ICD-10-CM

## 2017-05-29 ENCOUNTER — Other Ambulatory Visit: Payer: Self-pay | Admitting: Adult Health

## 2017-05-30 MED ORDER — ZOLPIDEM TARTRATE 5 MG PO TABS: 5.0000 mg | ORAL_TABLET | Freq: Every evening | ORAL | 0 refills | Status: DC | PRN

## 2017-05-30 NOTE — Telephone Encounter (Signed)
Called to the pharmacy and left on machine. 

## 2017-06-09 ENCOUNTER — Ambulatory Visit (INDEPENDENT_AMBULATORY_CARE_PROVIDER_SITE_OTHER): Payer: PPO | Admitting: Adult Health

## 2017-06-09 ENCOUNTER — Encounter: Payer: Self-pay | Admitting: Adult Health

## 2017-06-09 VITALS — BP 122/74 | Temp 98.3°F | Ht 68.0 in | Wt 199.0 lb

## 2017-06-09 DIAGNOSIS — Z125 Encounter for screening for malignant neoplasm of prostate: Secondary | ICD-10-CM

## 2017-06-09 DIAGNOSIS — Z0000 Encounter for general adult medical examination without abnormal findings: Secondary | ICD-10-CM

## 2017-06-09 DIAGNOSIS — E119 Type 2 diabetes mellitus without complications: Secondary | ICD-10-CM

## 2017-06-09 DIAGNOSIS — Z794 Long term (current) use of insulin: Secondary | ICD-10-CM | POA: Diagnosis not present

## 2017-06-09 DIAGNOSIS — E782 Mixed hyperlipidemia: Secondary | ICD-10-CM | POA: Diagnosis not present

## 2017-06-09 LAB — CBC WITH DIFFERENTIAL/PLATELET
BASOS ABS: 0 10*3/uL (ref 0.0–0.1)
Basophils Relative: 0.5 % (ref 0.0–3.0)
Eosinophils Absolute: 0.1 10*3/uL (ref 0.0–0.7)
Eosinophils Relative: 1.5 % (ref 0.0–5.0)
HCT: 45.2 % (ref 39.0–52.0)
Hemoglobin: 15 g/dL (ref 13.0–17.0)
LYMPHS ABS: 1.8 10*3/uL (ref 0.7–4.0)
Lymphocytes Relative: 23.4 % (ref 12.0–46.0)
MCHC: 33.1 g/dL (ref 30.0–36.0)
MCV: 84.9 fl (ref 78.0–100.0)
MONO ABS: 0.6 10*3/uL (ref 0.1–1.0)
Monocytes Relative: 8.2 % (ref 3.0–12.0)
NEUTROS PCT: 66.4 % (ref 43.0–77.0)
Neutro Abs: 5.1 10*3/uL (ref 1.4–7.7)
Platelets: 254 10*3/uL (ref 150.0–400.0)
RBC: 5.33 Mil/uL (ref 4.22–5.81)
RDW: 14.9 % (ref 11.5–15.5)
WBC: 7.7 10*3/uL (ref 4.0–10.5)

## 2017-06-09 LAB — PSA: PSA: 16 ng/mL — ABNORMAL HIGH (ref 0.10–4.00)

## 2017-06-09 LAB — LIPID PANEL
CHOL/HDL RATIO: 6
Cholesterol: 157 mg/dL (ref 0–200)
HDL: 28.5 mg/dL — AB (ref >39.00)
Triglycerides: 626 mg/dL — ABNORMAL HIGH (ref 0.0–149.0)

## 2017-06-09 LAB — BASIC METABOLIC PANEL
BUN: 21 mg/dL (ref 6–23)
CALCIUM: 9.4 mg/dL (ref 8.4–10.5)
CO2: 25 mEq/L (ref 19–32)
Chloride: 102 mEq/L (ref 96–112)
Creatinine, Ser: 1.19 mg/dL (ref 0.40–1.50)
GFR: 64.8 mL/min (ref >60.00)
GLUCOSE: 259 mg/dL — AB (ref 70–99)
Potassium: 4.2 mEq/L (ref 3.5–5.1)
SODIUM: 136 meq/L (ref 135–145)

## 2017-06-09 LAB — HEPATIC FUNCTION PANEL
ALK PHOS: 58 U/L (ref 39–117)
ALT: 12 U/L (ref 0–53)
AST: 12 U/L (ref 0–37)
Albumin: 4.3 g/dL (ref 3.5–5.2)
BILIRUBIN DIRECT: 0.1 mg/dL (ref 0.0–0.3)
BILIRUBIN TOTAL: 0.9 mg/dL (ref 0.2–1.2)
Total Protein: 6.6 g/dL (ref 6.0–8.3)

## 2017-06-09 LAB — TSH: TSH: 2.78 u[IU]/mL (ref 0.35–4.50)

## 2017-06-09 LAB — LDL CHOLESTEROL, DIRECT: LDL DIRECT: 61 mg/dL

## 2017-06-09 LAB — HEMOGLOBIN A1C: HEMOGLOBIN A1C: 9.6 % — AB (ref 4.6–6.5)

## 2017-06-09 MED ORDER — SILDENAFIL CITRATE 50 MG PO TABS: ORAL_TABLET | ORAL | 6 refills | Status: AC

## 2017-06-09 NOTE — Progress Notes (Signed)
Subjective:    Patient ID: Bernard Roberts, male    DOB: 1951/09/28, 66 y.o.   MRN: [id number]  HPI  Patient presents for yearly preventative medicine examination. He is a pleasant 66 year old male who  has a past medical history of Diabetes mellitus without complication (HCC); Elevated PSA; Gout; Hyperlipidemia; and Hypertension.  All immunizations and health maintenance protocols were reviewed with the patient and needed orders were placed. He is up to date on vacciantions   Appropriate screening laboratory values were ordered for the patient including screening of hyperlipidemia, renal function and hepatic function.  Medication reconciliation,  past medical history, social history, problem list and allergies were reviewed in detail with the patient  Goals were established with regard to weight loss, exercise, and  diet in compliance with medications  End of life planning was discussed.  He takes Trulicity 1.5 mg  and Metformin XR 1000 mg in the morning and 1500 mg in the evening. He has an appointment with Dr. Everardo All ( per his request) next month. He reports that he has been "eating way too many carbs". He is not exercising on a regular basis   He takes Cozaar 100 mg daily for control of hypertension. Today in the office his blood pressure is 122/74  He takes Ambien 10 mg nightly PRN for insomnia.    Review of Systems  Constitutional: Negative.   HENT: Negative.   Eyes: Negative.   Respiratory: Negative.   Cardiovascular: Negative.   Gastrointestinal: Negative.   Endocrine: Negative.   Genitourinary: Negative.   Musculoskeletal: Negative.   Skin: Negative.   Allergic/Immunologic: Negative.   Neurological: Negative.   Hematological: Negative.   Psychiatric/Behavioral: Negative.   All other systems reviewed and are negative.  Past Medical History:  Diagnosis Date  . Diabetes mellitus without complication (HCC)   . Elevated PSA    Around 15   . Gout   .  Hyperlipidemia   . Hypertension     Social History   Social History  . Marital status: Married    Spouse name: N/A  . Number of children: N/A  . Years of education: N/A   Occupational History  . Not on file.   Social History Main Topics  . Smoking status: Never Smoker  . Smokeless tobacco: Never Used  . Alcohol use 0.0 oz/week     Comment: 3-4 week  . Drug use: No  . Sexual activity: Not on file   Other Topics Concern  . Not on file   Social History Narrative   Works for as  [employer]    He likes to swim, write, hike.     Past Surgical History:  Procedure Laterality Date  . KNEE ARTHROPLASTY     unknown which side    Family History  Problem Relation Age of Onset  . Diabetes Father   . Heart disease Father   . Hypertension Father   . Stroke Father   . Diabetes Sister   . Mental illness Sister        Parnoid   . Alcohol abuse Brother   . Hypertension Brother     Allergies  Allergen Reactions  . Victoza [Liraglutide] Nausea And Vomiting    Current Outpatient Prescriptions on File Prior to Visit  Medication Sig Dispense Refill  . diclofenac (VOLTAREN) 75 MG EC tablet TAKE ONE TABLET BY MOUTH TWICE A DAY 60 tablet 2  . Dulaglutide (TRULICITY) 1.5 MG/0.5ML SOPN Inject 1.5 mg into the skin  once a week. 4 pen 11  . losartan (COZAAR) 100 MG tablet Take 100 mg by mouth daily.    . metformin (FORTAMET) 500 MG (OSM) 24 hr tablet Take 2 tablets by mouth every morning and 3 tablets every evening. 450 tablet 3  . zolpidem (AMBIEN) 5 MG tablet Take 1 tablet (5 mg total) by mouth at bedtime as needed. 30 tablet 0   No current facility-administered medications on file prior to visit.     BP 122/74 (BP Location: Left Arm)   Temp 98.3 F (36.8 C) (Oral)   Ht 5\' 8"  (1.727 m)   Wt 199 lb (90.3 kg)   BMI 30.26 kg/m       Objective:   Physical Exam  Constitutional: He is oriented to person, place, and time. He appears well-developed and well-nourished. No distress.    Obese    HENT:  Head: Normocephalic and atraumatic.  Right Ear: External ear normal.  Left Ear: External ear normal.  Nose: Nose normal.  Mouth/Throat: Oropharynx is clear and moist. No oropharyngeal exudate.  Eyes: Pupils are equal, round, and reactive to light. Conjunctivae are normal. Right eye exhibits no discharge. Left eye exhibits no discharge.  Neck: Normal range of motion. Neck supple. No JVD present. No tracheal deviation present. No thyromegaly present.  Cardiovascular: Normal rate, regular rhythm, normal heart sounds and intact distal pulses.  Exam reveals no gallop and no friction rub.   No murmur heard. Pulmonary/Chest: Effort normal and breath sounds normal. No stridor. No respiratory distress. He has no wheezes. He has no rales. He exhibits no tenderness.  Abdominal: Soft. Bowel sounds are normal. He exhibits no distension and no mass. There is no tenderness. There is no rebound and no guarding.  Genitourinary: Rectum normal. Rectal exam shows guaiac negative stool. Prostate is enlarged. Prostate is not tender.  Musculoskeletal: Normal range of motion. He exhibits no edema, tenderness or deformity.  Lymphadenopathy:    He has no cervical adenopathy.  Neurological: He is alert and oriented to person, place, and time. He has normal reflexes. He displays normal reflexes. No cranial nerve deficit. He exhibits normal muscle tone. Coordination normal.  Skin: Skin is warm and dry. No rash noted. He is not diaphoretic. No erythema. No pallor.  Psychiatric: He has a normal mood and affect. His behavior is normal. Judgment and thought content normal.  Nursing note and vitals reviewed.     Assessment & Plan:  1. Routine general medical examination at a health care facility - Needs to work on diet and exercise to lose weight  - Follow up in one year or sooner if needed - Basic metabolic panel - CBC with Differential/Platelet - Hemoglobin A1c - Hepatic function panel - Lipid  panel - PSA - TSH  2. Controlled type 2 diabetes mellitus without complication, with long-term current use of insulin (HCC) - Educated on diet and exercise. I do not think he necessarily needs to see endocrinology, he needs to work on his diet, but he insists.  - Basic metabolic panel - CBC with Differential/Platelet - Hemoglobin A1c - Hepatic function panel - Lipid panel - PSA - TSH  3. Mixed hyperlipidemia - Consider statin medication  - Basic metabolic panel - CBC with Differential/Platelet - Hemoglobin A1c - Hepatic function panel - Lipid panel - PSA - TSH  Shirline Freesory Nylani Michetti, NP

## 2017-06-09 NOTE — Patient Instructions (Signed)
It was good seeing you today   I will follow up with you regarding your lab work   Please work on diet and exercise   Follow up in one year or sooner if needed  Health Maintenance, Male A healthy lifestyle and preventative care can promote health and wellness.  Maintain regular health, dental, and eye exams.  Eat a healthy diet. Foods like vegetables, fruits, whole grains, low-fat dairy products, and lean protein foods contain the nutrients you need and are low in calories. Decrease your intake of foods high in solid fats, added sugars, and salt. Get information about a proper diet from your health care provider, if necessary.  Regular physical exercise is one of the most important things you can do for your health. Most adults should get at least 150 minutes of moderate-intensity exercise (any activity that increases your heart rate and causes you to sweat) each week. In addition, most adults need muscle-strengthening exercises on 2 or more days a week.   Maintain a healthy weight. The body mass index (BMI) is a screening tool to identify possible weight problems. It provides an estimate of body fat based on height and weight. Your health care provider can find your BMI and can help you achieve or maintain a healthy weight. For males 20 years and older:  A BMI below 18.5 is considered underweight.  A BMI of 18.5 to 24.9 is normal.  A BMI of 25 to 29.9 is considered overweight.  A BMI of 30 and above is considered obese.  Maintain normal blood lipids and cholesterol by exercising and minimizing your intake of saturated fat. Eat a balanced diet with plenty of fruits and vegetables. Blood tests for lipids and cholesterol should begin at age 67 and be repeated every 5 years. If your lipid or cholesterol levels are high, you are over age 67, or you are at high risk for heart disease, you may need your cholesterol levels checked more frequently.Ongoing high lipid and cholesterol levels should  be treated with medicines if diet and exercise are not working.  If you smoke, find out from your health care provider how to quit. If you do not use tobacco, do not start.  Lung cancer screening is recommended for adults aged 55-80 years who are at high risk for developing lung cancer because of a history of smoking. A yearly low-dose CT scan of the lungs is recommended for people who have at least a 30-pack-year history of smoking and are current smokers or have quit within the past 15 years. A pack year of smoking is smoking an average of 1 pack of cigarettes a day for 1 year (for example, a 30-pack-year history of smoking could mean smoking 1 pack a day for 30 years or 2 packs a day for 15 years). Yearly screening should continue until the smoker has stopped smoking for at least 15 years. Yearly screening should be stopped for people who develop a health problem that would prevent them from having lung cancer treatment.  If you choose to drink alcohol, do not have more than 2 drinks per day. One drink is considered to be 12 oz (360 mL) of beer, 5 oz (150 mL) of wine, or 1.5 oz (45 mL) of liquor.  Avoid the use of street drugs. Do not share needles with anyone. Ask for help if you need support or instructions about stopping the use of drugs.  High blood pressure causes heart disease and increases the risk of stroke. High  blood pressure is more likely to develop in:  People who have blood pressure in the end of the normal range (100-139/85-89 mm Hg).  People who are overweight or obese.  People who are African American.  If you are 42-48 years of age, have your blood pressure checked every 3-5 years. If you are 18 years of age or older, have your blood pressure checked every year. You should have your blood pressure measured twice--once when you are at a hospital or clinic, and once when you are not at a hospital or clinic. Record the average of the two measurements. To check your blood pressure  when you are not at a hospital or clinic, you can use:  An automated blood pressure machine at a pharmacy.  A home blood pressure monitor.  If you are 73-24 years old, ask your health care provider if you should take aspirin to prevent heart disease.  Diabetes screening involves taking a blood sample to check your fasting blood sugar level. This should be done once every 3 years after age 34 if you are at a normal weight and without risk factors for diabetes. Testing should be considered at a younger age or be carried out more frequently if you are overweight and have at least 1 risk factor for diabetes.  Colorectal cancer can be detected and often prevented. Most routine colorectal cancer screening begins at the age of 11 and continues through age 101. However, your health care provider may recommend screening at an earlier age if you have risk factors for colon cancer. On a yearly basis, your health care provider may provide home test kits to check for hidden blood in the stool. A small camera at the end of a tube may be used to directly examine the colon (sigmoidoscopy or colonoscopy) to detect the earliest forms of colorectal cancer. Talk to your health care provider about this at age 23 when routine screening begins. A direct exam of the colon should be repeated every 5-10 years through age 31, unless early forms of precancerous polyps or small growths are found.  People who are at an increased risk for hepatitis B should be screened for this virus. You are considered at high risk for hepatitis B if:  You were born in a country where hepatitis B occurs often. Talk with your health care provider about which countries are considered high risk.  Your parents were born in a high-risk country and you have not received a shot to protect against hepatitis B (hepatitis B vaccine).  You have HIV or AIDS.  You use needles to inject street drugs.  You live with, or have sex with, someone who has  hepatitis B.  You are a man who has sex with other men (MSM).  You get hemodialysis treatment.  You take certain medicines for conditions like cancer, organ transplantation, and autoimmune conditions.  Hepatitis C blood testing is recommended for all people born from [birth year] through 1966 and any individual with known risk factors for hepatitis C.  Healthy men should no longer receive prostate-specific antigen (PSA) blood tests as part of routine cancer screening. Talk to your health care provider about prostate cancer screening.  Testicular cancer screening is not recommended for adolescents or adult males who have no symptoms. Screening includes self-exam, a health care provider exam, and other screening tests. Consult with your health care provider about any symptoms you have or any concerns you have about testicular cancer.  Practice safe sex. Use condoms and  avoid high-risk sexual practices to reduce the spread of sexually transmitted infections (STIs).  You should be screened for STIs, including gonorrhea and chlamydia if:  You are sexually active and are younger than 24 years.  You are older than 24 years, and your health care provider tells you that you are at risk for this type of infection.  Your sexual activity has changed since you were last screened, and you are at an increased risk for chlamydia or gonorrhea. Ask your health care provider if you are at risk.  If you are at risk of being infected with HIV, it is recommended that you take a prescription medicine daily to prevent HIV infection. This is called pre-exposure prophylaxis (PrEP). You are considered at risk if:  You are a man who has sex with other men (MSM).  You are a heterosexual man who is sexually active with multiple partners.  You take drugs by injection.  You are sexually active with a partner who has HIV.  Talk with your health care provider about whether you are at high risk of being infected with HIV. If  you choose to begin PrEP, you should first be tested for HIV. You should then be tested every 3 months for as long as you are taking PrEP.  Use sunscreen. Apply sunscreen liberally and repeatedly throughout the day. You should seek shade when your shadow is shorter than you. Protect yourself by wearing long sleeves, pants, a wide-brimmed hat, and sunglasses year round whenever you are outdoors.  Tell your health care provider of new moles or changes in moles, especially if there is a change in shape or color. Also, tell your health care provider if a mole is larger than the size of a pencil eraser.  A one-time screening for abdominal aortic aneurysm (AAA) and surgical repair of large AAAs by ultrasound is recommended for men aged 31-75 years who are current or former smokers.  Stay current with your vaccines (immunizations).   This information is not intended to replace advice given to you by your health care provider. Make sure you discuss any questions you have with your health care provider.   Document Released: 03/17/2008 Document Revised: 10/10/2014 Document Reviewed: 02/14/2011 Elsevier Interactive Patient Education [employer].

## 2017-06-23 ENCOUNTER — Encounter: Payer: Self-pay | Admitting: Adult Health

## 2017-06-27 ENCOUNTER — Other Ambulatory Visit: Payer: Self-pay | Admitting: Adult Health

## 2017-06-28 MED ORDER — ZOLPIDEM TARTRATE 5 MG PO TABS
5.0000 mg | ORAL_TABLET | Freq: Every evening | ORAL | 2 refills | Status: DC | PRN
Start: 2017-06-28 — End: 2017-12-04

## 2017-06-28 NOTE — Telephone Encounter (Signed)
Called to the pharmacy and left on machine. 

## 2017-07-04 ENCOUNTER — Encounter: Payer: Self-pay | Admitting: Endocrinology

## 2017-07-04 ENCOUNTER — Ambulatory Visit (INDEPENDENT_AMBULATORY_CARE_PROVIDER_SITE_OTHER): Payer: PPO | Admitting: Endocrinology

## 2017-07-04 VITALS — BP 120/74 | HR 89 | Wt 202.8 lb

## 2017-07-04 DIAGNOSIS — Z794 Long term (current) use of insulin: Secondary | ICD-10-CM

## 2017-07-04 DIAGNOSIS — E119 Type 2 diabetes mellitus without complications: Secondary | ICD-10-CM

## 2017-07-04 MED ORDER — CANAGLIFLOZIN 300 MG PO TABS: 300.0000 mg | ORAL_TABLET | Freq: Every day | ORAL | 3 refills | Status: DC

## 2017-07-04 NOTE — Patient Instructions (Addendum)
good diet and exercise significantly improve the control of your diabetes.  please let me know if you wish to be referred to a dietician.  high blood sugar is very risky to your health.  you should see an eye doctor and dentist every year.  It is very important to get all recommended vaccinations.  Controlling your blood pressure and cholesterol drastically reduces the damage diabetes does to your body.  Those who smoke should quit.  Please discuss these with your doctor.  check your blood sugar once a day.  vary the time of day when you check, between before the 3 meals, and at bedtime.  also check if you have symptoms of your blood sugar being too high or too low.  please keep a record of the readings and bring it to your next appointment here (or you can bring the meter itself).  You can write it on any piece of paper.  please call us sooner if your blood sugar goes below 70, or if you have a lot of readings over 200. I have sent a prescription to your pharmacy, to add "invokana."   Please come back to the [facility] office in 2 weeks, for a blood test.  Please come back for a follow-up appointment in 2-3 months.  Here is a list of the possible meds we add on if it is high.  If we need to add another med, please tell us your preference.

## 2017-07-04 NOTE — Progress Notes (Signed)
Subjective:    Patient ID: Bernard Roberts, male    DOB: 28-Apr-1951, 66 y.o.   MRN: [id number]  HPI pt is referred by Shirline Frees, NP, for diabetes.  Pt states DM was dx'ed in 1998; he has mild neuropathy of the lower extremities; he is unaware of any associated chronic complications; he took insulin approx 2014-2017;  pt says his diet and exercise are not good; he has never had pancreatitis, pancreatic surgery, severe hypoglycemia or DKA.  He takes metformin and trulicity.   Past Medical History:  Diagnosis Date  . Diabetes mellitus without complication (HCC)   . Elevated PSA    Around 14   . Gout   . Hyperlipidemia   . Hypertension     Past Surgical History:  Procedure Laterality Date  . KNEE ARTHROPLASTY     unknown which side    Social History   Social History  . Marital status: Married    Spouse name: N/A  . Number of children: N/A  . Years of education: N/A   Occupational History  . Not on file.   Social History Main Topics  . Smoking status: Never Smoker  . Smokeless tobacco: Never Used  . Alcohol use 0.0 oz/week     Comment: 3-4 week  . Drug use: No  . Sexual activity: Not on file   Other Topics Concern  . Not on file   Social History Narrative   Works for as  [employer]    He likes to swim, write, hike.     Current Outpatient Prescriptions on File Prior to Visit  Medication Sig Dispense Refill  . aspirin EC 81 MG tablet Take 81 mg by mouth daily.    . diclofenac (VOLTAREN) 75 MG EC tablet TAKE ONE TABLET BY MOUTH TWICE A DAY 60 tablet 2  . Dulaglutide (TRULICITY) 1.5 MG/0.5ML SOPN Inject 1.5 mg into the skin once a week. 4 pen 11  . losartan (COZAAR) 100 MG tablet Take 100 mg by mouth daily.    . metformin (FORTAMET) 500 MG (OSM) 24 hr tablet Take 2 tablets by mouth every morning and 3 tablets every evening. 450 tablet 3  . sildenafil (VIAGRA) 50 MG tablet Take 1-2 tablets as needed 30 tablet 6  . tamsulosin (FLOMAX) 0.4 MG CAPS capsule Take 1 capsule by  mouth daily.    Marland Kitchen zolpidem (AMBIEN) 5 MG tablet Take 1 tablet (5 mg total) by mouth at bedtime as needed. 30 tablet 2   No current facility-administered medications on file prior to visit.     Allergies  Allergen Reactions  . Victoza [Liraglutide] Nausea And Vomiting    Family History  Problem Relation Age of Onset  . Diabetes Father   . Heart disease Father   . Hypertension Father   . Stroke Father   . Diabetes Sister   . Mental illness Sister        Parnoid   . Alcohol abuse Brother   . Hypertension Brother     BP 120/74   Pulse 89   Wt 202 lb 12.8 oz (92 kg)   SpO2 96%   BMI 30.84 kg/m     Review of Systems denies blurry vision, headache, chest pain, sob, n/v, excessive diaphoresis, memory loss, cold intolerance, rhinorrhea, and easy bruising.  He has lost weight, due to his efforts. He has urinary frequency and leg cramps.      Objective:   Physical Exam VS: see vs page GEN: no distress  HEAD: head: no deformity eyes: no periorbital swelling, no proptosis external nose and ears are normal mouth: no lesion seen NECK: supple, thyroid is not enlarged CHEST WALL: no deformity LUNGS: clear to auscultation CV: reg rate and rhythm, no murmur ABD: abdomen is soft, nontender.  no hepatosplenomegaly.  not distended.  Self-reducing ventral hernia.  MUSCULOSKELETAL: muscle bulk and strength are grossly normal.  no obvious joint swelling.  gait is normal and steady EXTEMITIES: no deformity.  no ulcer on the feet.  feet are of normal color and temp.  Trace bilat leg edema.  There is bilateral onychomycosis of the toenails.   PULSES: dorsalis pedis intact bilat.  no carotid bruit NEURO:  cn 2-12 grossly intact.   readily moves all 4's.  sensation is intact to touch on the feet SKIN:  Normal texture and temperature.  No rash or suspicious lesion is visible.   NODES:  None palpable at the neck PSYCH: alert, well-oriented.  Does not appear anxious nor depressed.    Lab  Results  Component Value Date   HGBA1C 9.6 (H) 06/09/2017   Lab Results  Component Value Date   CREATININE 1.19 06/09/2017   BUN 21 06/09/2017   NA 136 06/09/2017   K 4.2 06/09/2017   CL 102 06/09/2017   CO2 25 06/09/2017   Lab Results  Component Value Date   MICROALBUR 3.2 (H) 05/30/2016   I personally reviewed electrocardiogram tracing (06/03/16): Indication: wellness.  Impression: NSR.  No MI.  LAHB.  No comparison is available     Assessment & Plan:  Type 2 DM: he needs increased rx Renal insuff, new to me: this limits rx options.    Patient Instructions  good diet and exercise significantly improve the control of your diabetes.  please let me know if you wish to be referred to a dietician.  high blood sugar is very risky to your health.  you should see an eye doctor and dentist every year.  It is very important to get all recommended vaccinations.  Controlling your blood pressure and cholesterol drastically reduces the damage diabetes does to your body.  Those who smoke should quit.  Please discuss these with your doctor.  check your blood sugar once a day.  vary the time of day when you check, between before the 3 meals, and at bedtime.  also check if you have symptoms of your blood sugar being too high or too low.  please keep a record of the readings and bring it to your next appointment here (or you can bring the meter itself).  You can write it on any piece of paper.  please call us sooner if your blood sugar goes below 70, or if you have a lot of readings over 200. I have sent a prescription to your pharmacy, to add "invokana."   Please come back to the [facility] office in 2 weeks, for a blood test.  Please come back for a follow-up appointment in 2-3 months.  Here is a list of the possible meds we add on if it is high.  If we need to add another med, please tell us your preference.

## 2017-07-11 ENCOUNTER — Other Ambulatory Visit: Payer: Self-pay | Admitting: Adult Health

## 2017-07-11 ENCOUNTER — Encounter: Payer: Self-pay | Admitting: Adult Health

## 2017-07-11 MED ORDER — TAMSULOSIN HCL 0.4 MG PO CAPS: 0.4000 mg | ORAL_CAPSULE | Freq: Every day | ORAL | 1 refills | Status: DC

## 2017-07-30 ENCOUNTER — Encounter: Payer: Self-pay | Admitting: Adult Health

## 2017-08-31 ENCOUNTER — Encounter: Payer: Self-pay | Admitting: Adult Health

## 2017-09-01 MED ORDER — DICLOFENAC SODIUM 75 MG PO TBEC: 75.0000 mg | DELAYED_RELEASE_TABLET | Freq: Two times a day (BID) | ORAL | 1 refills | Status: DC

## 2017-09-04 ENCOUNTER — Encounter: Payer: Self-pay | Admitting: Adult Health

## 2017-09-06 ENCOUNTER — Encounter: Payer: Self-pay | Admitting: Adult Health

## 2017-09-06 ENCOUNTER — Ambulatory Visit: Payer: PPO | Admitting: Adult Health

## 2017-09-06 VITALS — BP 108/64 | HR 87 | Temp 98.7°F | Resp 16 | Wt 199.4 lb

## 2017-09-06 DIAGNOSIS — Z794 Long term (current) use of insulin: Secondary | ICD-10-CM | POA: Diagnosis not present

## 2017-09-06 DIAGNOSIS — Z23 Encounter for immunization: Secondary | ICD-10-CM

## 2017-09-06 DIAGNOSIS — F40243 Fear of flying: Secondary | ICD-10-CM

## 2017-09-06 DIAGNOSIS — Z760 Encounter for issue of repeat prescription: Secondary | ICD-10-CM

## 2017-09-06 DIAGNOSIS — E119 Type 2 diabetes mellitus without complications: Secondary | ICD-10-CM | POA: Diagnosis not present

## 2017-09-06 MED ORDER — TAMSULOSIN HCL 0.4 MG PO CAPS: 0.4000 mg | ORAL_CAPSULE | Freq: Every day | ORAL | 3 refills | Status: DC

## 2017-09-06 MED ORDER — CLONAZEPAM 1 MG PO TABS: ORAL_TABLET | ORAL | 0 refills | Status: DC

## 2017-09-06 NOTE — Progress Notes (Signed)
Subjective:    Patient ID: Bernard Roberts, male    DOB: 1951/09/17, 66 y.o.   MRN: [id number]  HPI  [age] year old male who  has a past medical history of Diabetes mellitus without complication (HCC), Elevated PSA, Gout, Hyperlipidemia, and Hypertension. He presents to the office today for refill of Flomax.   He is going [place] to [place] over the [place] and needs something for claustrophobia. Report taking Xanax in the past but did not feel like it worked too well.    Review of Systems See HPI   Past Medical History:  Diagnosis Date  . Diabetes mellitus without complication (HCC)   . Elevated PSA    Around 14   . Gout   . Hyperlipidemia   . Hypertension     Social History   Socioeconomic History  . Marital status: Married    Spouse name: Not on file  . Number of children: Not on file  . Years of education: Not on file  . Highest education level: Not on file  Social Needs  . Financial resource strain: Not on file  . Food insecurity - worry: Not on file  . Food insecurity - inability: Not on file  . Transportation needs - medical: Not on file  . Transportation needs - non-medical: Not on file  Occupational History  . Not on file  Tobacco Use  . Smoking status: Never Smoker  . Smokeless tobacco: Never Used  Substance and Sexual Activity  . Alcohol use: Yes    Alcohol/week: 0.0 oz    Comment: 3-4 week  . Drug use: No  . Sexual activity: Not on file  Other Topics Concern  . Not on file  Social History Narrative   Works for as  [employer]    He likes to swim, write, hike.     Past Surgical History:  Procedure Laterality Date  . KNEE ARTHROPLASTY     unknown which side    Family History  Problem Relation Age of Onset  . Diabetes Father   . Heart disease Father   . Hypertension Father   . Stroke Father   . Diabetes Sister   . Mental illness Sister        Parnoid   . Alcohol abuse Brother   . Hypertension Brother     Allergies  Allergen  Reactions  . Victoza [Liraglutide] Nausea And Vomiting    Current Outpatient Medications on File Prior to Visit  Medication Sig Dispense Refill  . aspirin EC 81 MG tablet Take 81 mg by mouth daily.    . canagliflozin (INVOKANA) 300 MG TABS tablet Take 1 tablet (300 mg total) by mouth daily before breakfast. 90 tablet 3  . diclofenac (VOLTAREN) 75 MG EC tablet Take 1 tablet (75 mg total) by mouth 2 (two) times daily. 180 tablet 1  . Dulaglutide (TRULICITY) 1.5 MG/0.5ML SOPN Inject 1.5 mg into the skin once a week. 4 pen 11  . losartan (COZAAR) 100 MG tablet Take 100 mg by mouth daily.    . metformin (FORTAMET) 500 MG (OSM) 24 hr tablet Take 2 tablets by mouth every morning and 3 tablets every evening. 450 tablet 3  . sildenafil (VIAGRA) 50 MG tablet Take 1-2 tablets as needed 30 tablet 6  . zolpidem (AMBIEN) 5 MG tablet Take 1 tablet (5 mg total) by mouth at bedtime as needed. 30 tablet 2  . testosterone cypionate (DEPOTESTOSTERONE CYPIONATE) 200 MG/ML injection Inject 200  mg into the muscle every 14 (fourteen) days.     No current facility-administered medications on file prior to visit.     BP 108/64 (BP Location: Left Arm, Patient Position: Sitting, Cuff Size: Normal)   Pulse 87   Temp 98.7 F (37.1 C) (Oral)   Resp 16   Wt 199 lb 6.4 oz (90.4 kg)   SpO2 97%   BMI 30.32 kg/m       Objective:   Physical Exam  Constitutional: He appears well-developed and well-nourished. No distress.  Cardiovascular: Normal rate, regular rhythm, normal heart sounds and intact distal pulses. Exam reveals no gallop and no friction rub.  No murmur heard. Pulmonary/Chest: Effort normal and breath sounds normal. No respiratory distress. He has no wheezes. He has no rales. He exhibits no tenderness.  Neurological: He is alert.  Skin: Skin is warm and dry. No rash noted. He is not diaphoretic. No erythema. No pallor.  Psychiatric: He has a normal mood and affect. His behavior is normal. Judgment and  thought content normal.  Nursing note and vitals reviewed.     Assessment & Plan:  1. Anxiety with flying  - clonazePAM (KLONOPIN) 1 MG tablet; Take 0.5 to 1 pill before flight  Dispense: 8 tablet; Refill: 0  2. Medication refill  - tamsulosin (FLOMAX) 0.4 MG CAPS capsule; Take 1 capsule (0.4 mg total) by mouth daily.  Dispense: 90 capsule; Refill: 3  3. Encounter for immunization  - Flu vaccine HIGH DOSE PF  Bernard Freesory Yesli Vanderhoff, NP

## 2017-09-06 NOTE — Addendum Note (Signed)
Addended by: Charna ElizabethLEMMONS, STEPHANIE L on: 09/06/2017 04:35 PM   Modules accepted: Orders

## 2017-09-07 LAB — BASIC METABOLIC PANEL
BUN: 22 mg/dL (ref 6–23)
CHLORIDE: 102 meq/L (ref 96–112)
CO2: 22 meq/L (ref 19–32)
Calcium: 9.3 mg/dL (ref 8.4–10.5)
Creatinine, Ser: 1.33 mg/dL (ref 0.40–1.50)
GFR: 56.96 mL/min — ABNORMAL LOW (ref >60.00)
Glucose, Bld: 172 mg/dL — ABNORMAL HIGH (ref 70–99)
POTASSIUM: 3.9 meq/L (ref 3.5–5.1)
Sodium: 136 mEq/L (ref 135–145)

## 2017-09-08 LAB — FRUCTOSAMINE: FRUCTOSAMINE: 324 umol/L — AB (ref 190–270)

## 2017-09-11 ENCOUNTER — Encounter: Payer: Self-pay | Admitting: Endocrinology

## 2017-09-13 ENCOUNTER — Other Ambulatory Visit: Payer: Self-pay | Admitting: Endocrinology

## 2017-09-13 MED ORDER — CANAGLIFLOZIN 300 MG PO TABS: 300.0000 mg | ORAL_TABLET | Freq: Every day | ORAL | 3 refills | Status: DC

## 2017-09-19 NOTE — Telephone Encounter (Signed)
Jennet calling from [employer] calling on the status of patients claim please advise.  [phone]

## 2017-10-02 IMAGING — MR MR PROSTATE WO/W CM
23 of 56 series · 23 of 56 positions shown · IV contrast (yes)
Comparison: None.

CLINICAL DATA: elevated PSA. eval for possible prostate ca. Pt
extremely claus. Pt was pulled in and out of scanner 7 times. Fast
series ran due to claus. Pt wanting out of scanner.

EXAM:
MR PROSTATE WITHOUT AND WITH CONTRAST
TECHNIQUE: Multiplanar multisequence MRI images were obtained of the pelvis
centered about the prostate. Pre and post contrast images were
obtained.
CONTRAST:  18mL MULTIHANCE GADOBENATE DIMEGLUMINE 529 MG/ML IV SOLN

[Series 3: bSSFP fat-sat · axial · 6.0mm · 0.86mm/px · 1 of 44 slices shown]
[im 1/44]
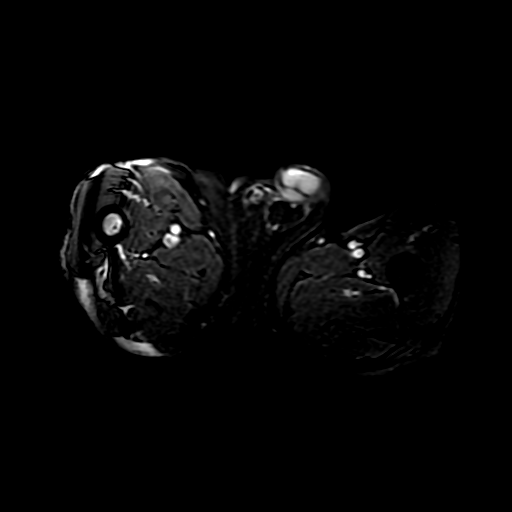

[Series 4: T1 · axial · 6.0mm · 0.86mm/px · 1 of 44 slices shown (1 of 3)]
[im 1/44]
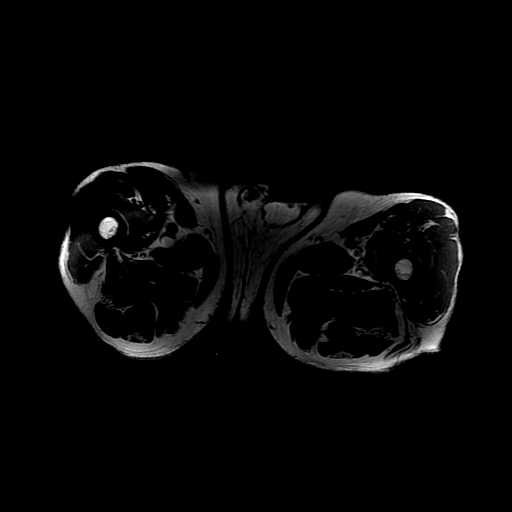

[Series 5: T2 · axial · 3.0mm · 0.29mm/px · 1 of 26 slices shown (1 of 4)]
[im 1/26]
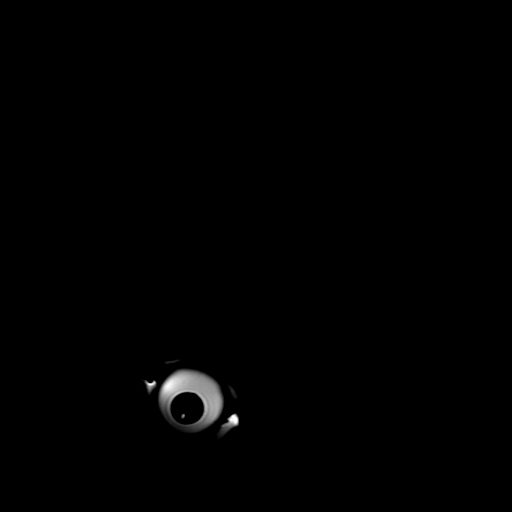

[Series 6: T1 · axial · 3.0mm · 0.29mm/px · 1 of 26 slices shown (2 of 3)]
[im 1/26]
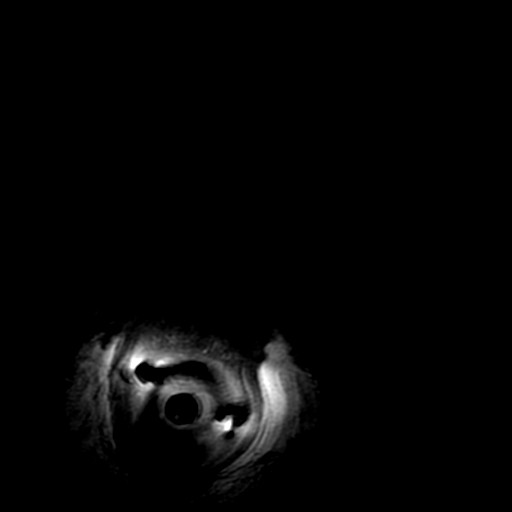

[Series 7: T2 · sagittal · 4.0mm · 0.29mm/px · 1 of 24 slices shown (2 of 4)]
[im 1/24]
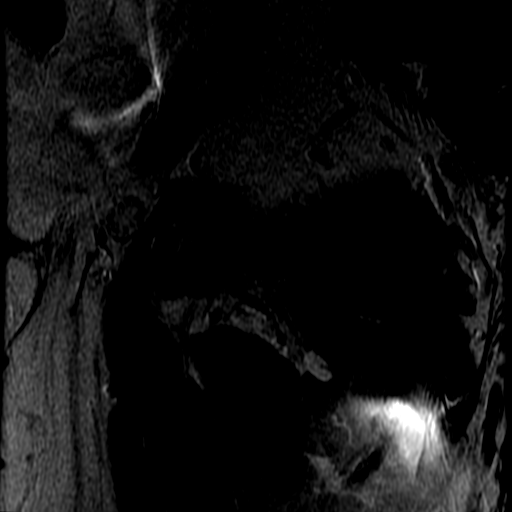

[Series 8: T2 · axial · 1.8mm · 0.47mm/px · 1 of 156 slices shown (3 of 4)]
[im 1/156]
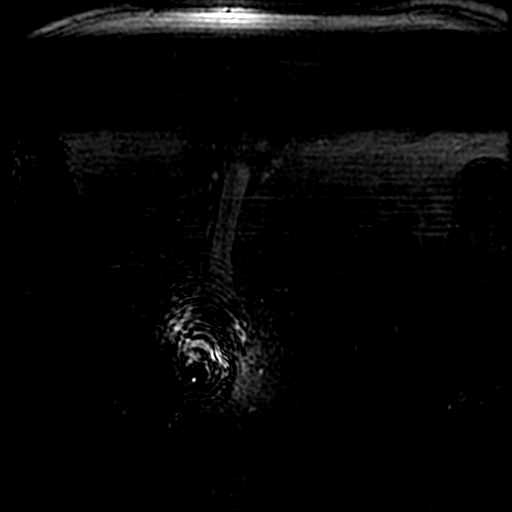

[Series 9: T1 · axial · 3.0mm · 0.29mm/px · 1 of 26 slices shown (3 of 3)]
[im 1/26]
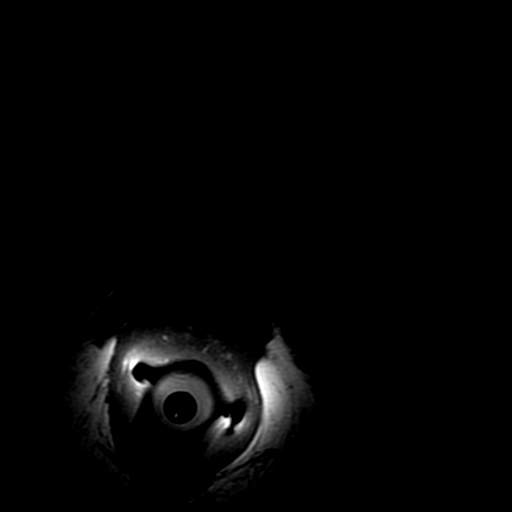

[Series 10: T2 · coronal · 4.0mm · 0.29mm/px · 1 of 22 slices shown (4 of 4)]
[im 1/22]
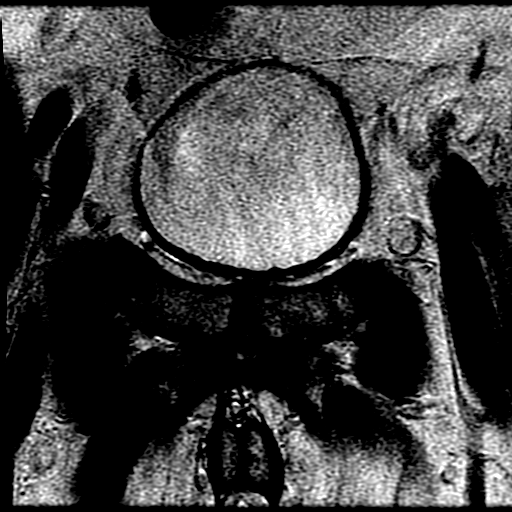

[Series 11: DWI · axial · 3.0mm · 0.59mm/px · 1 of 47 slices shown (1 of 6)]
[im 1/47]
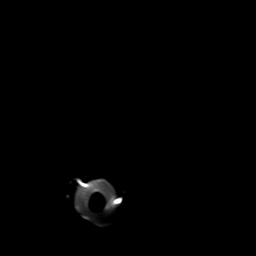

[Series 12: DWI · axial · 3.0mm · 0.59mm/px · 1 of 48 slices shown (2 of 6)]
[im 1/48]
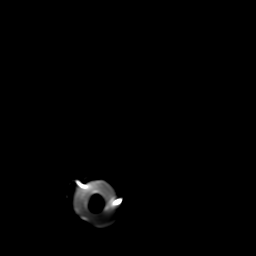

[Series 13: DWI · axial · 3.0mm · 0.59mm/px · 1 of 48 slices shown (3 of 6)]
[im 1/48]
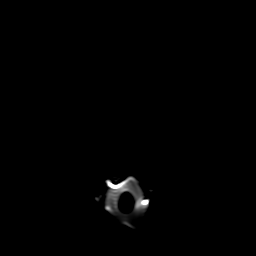

[Series 800: reformatted · axial · 1.8mm · 0.47mm/px · 1 of 70 slices shown (1 of 2)]
[im 1/70]
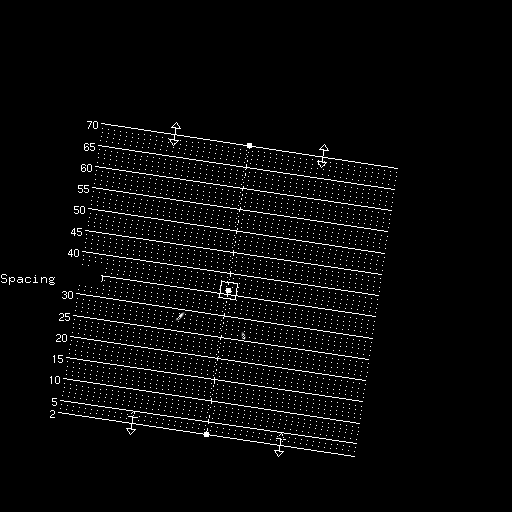

[Series 801: reformatted · axial · 1.8mm · 0.47mm/px · 1 of 126 slices shown (2 of 2)]
[im 1/126]
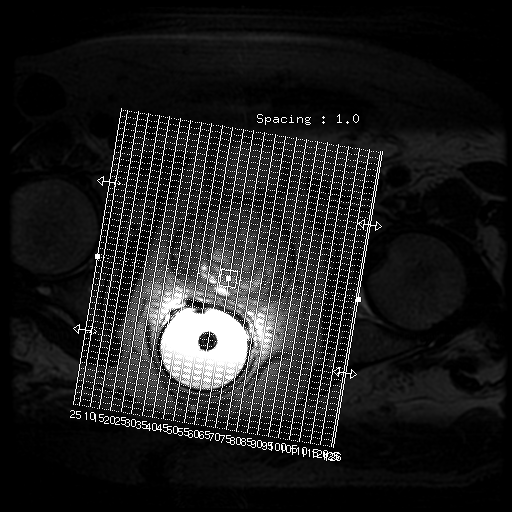

[Series 1100: DWI · axial · 3.0mm · 0.59mm/px · 1 of 24 slices shown (4 of 6)]
[im 1/24]
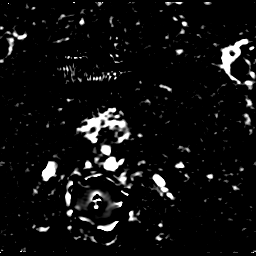

[Series 1200: DWI · axial · 3.0mm · 0.59mm/px · 1 of 24 slices shown (5 of 6)]
[im 1/24]
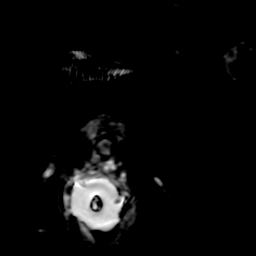

[Series 1300: DWI · axial · 3.0mm · 0.59mm/px · 1 of 24 slices shown (6 of 6)]
[im 1/24]
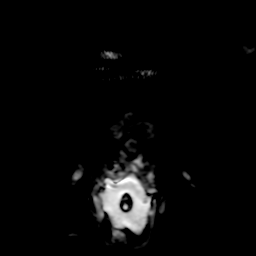

[((id)/(id)/1)-((id)/(id)/1) · axial · 3.0mm · 0.43mm/px · 1 of 72 slices shown (1 of 7)]
[im 1/72]
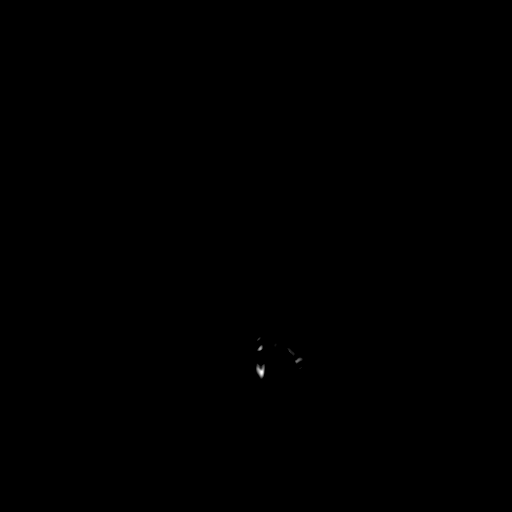

[((id)/(id)/1)-((id)/(id)/1) · axial · 3.0mm · 0.43mm/px · 1 of 72 slices shown (2 of 7)]
[im 1/72]
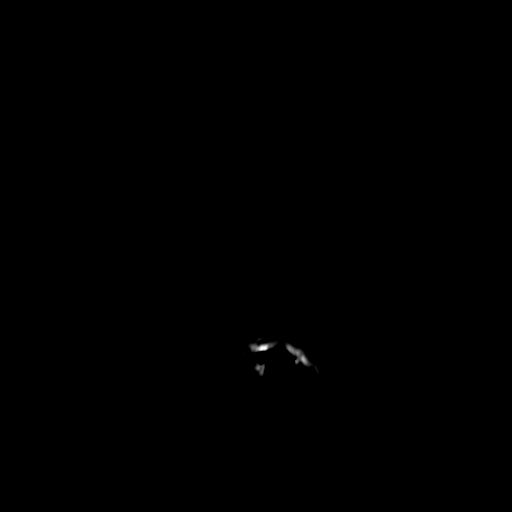

[((id)/(id)/1)-((id)/(id)/1) · axial · 3.0mm · 0.43mm/px · 1 of 72 slices shown (3 of 7)]
[im 1/72]
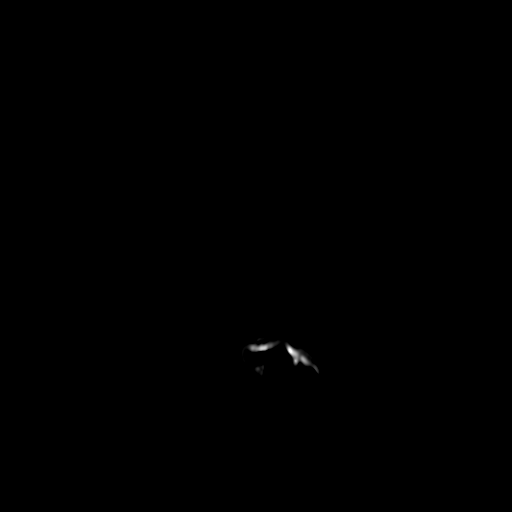

[((id)/(id)/1)-((id)/(id)/1) · axial · 3.0mm · 0.43mm/px · 1 of 72 slices shown (4 of 7)]
[im 1/72]
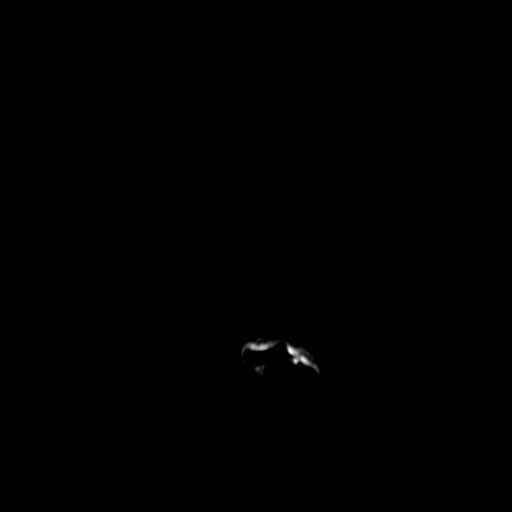

[((id)/(id)/1)-((id)/(id)/1) · axial · 3.0mm · 0.43mm/px · 1 of 72 slices shown (5 of 7)]
[im 1/72]
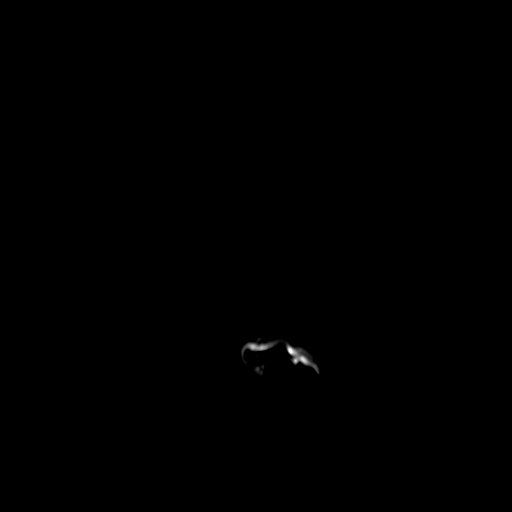

[((id)/(id)/1)-((id)/(id)/1) · axial · 3.0mm · 0.43mm/px · 1 of 72 slices shown (6 of 7)]
[im 1/72]
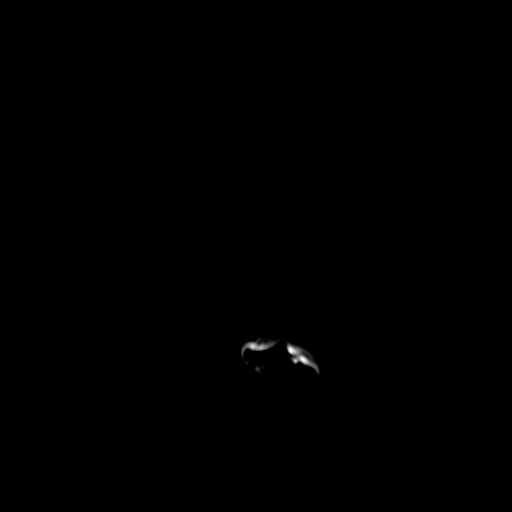

[((id)/(id)/1)-((id)/(id)/1) · axial · 3.0mm · 0.43mm/px · 1 of 72 slices shown (7 of 7)]
[im 1/72]
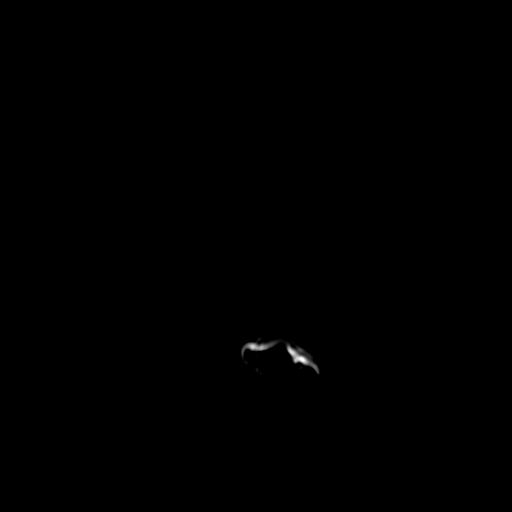

[23 of 56 positions shown; findings below may reference images not displayed]

FINDINGS: Prostate: There is no discrete lesion within the thin peripheral
zone on T2 weighted imaging. There is motion artifact on the B800
diffusion weighted series. No discrete lesion with restricted
diffusion identified. 3I555 series is essentially uninterpretable
due to patient motion.

Transitional zone is nodular with several large well capsulated
nodules.

Transcapsular spread:  Absent

Seminal vesicle involvement: Absent

Neurovascular bundle involvement: Absent

Pelvic adenopathy: Several small lymph nodes are identified at the
level of the aortic bifurcation

Including 8 mm lymph node along the LEFT common iliac artery (image
4, series 4) and similar 8 mm lymph node approximately at the RIGHT
common iliac artery.

Bone metastasis: Negative

Other findings: Prostate gland measures 5.9 x 5.0 x 5.8 cm (volume =
90 cm^3)
IMPRESSION: 1. Exam and particularly the diffusion-weighted imaging is severely
degraded by patient motion. Patient extremely claustrophobic which
required patient entering and exiting this scanner multiple times
during the scan.
2. No focal lesion within the peripheral zone on T2 weighted imaging
and limited diffusion-weighted imaging to localize high-grade
prostate carcinoma.
3. Nodular transitional zone without evidence of malignancy.
4. Small periaortic lymph nodes at the level of the aortic
bifurcation.
5. Prostate hypertrophy.

## 2017-10-17 ENCOUNTER — Other Ambulatory Visit: Payer: Self-pay | Admitting: Adult Health

## 2017-10-17 ENCOUNTER — Encounter: Payer: Self-pay | Admitting: Adult Health

## 2017-10-17 ENCOUNTER — Encounter: Payer: Self-pay | Admitting: Endocrinology

## 2017-10-17 DIAGNOSIS — H9190 Unspecified hearing loss, unspecified ear: Secondary | ICD-10-CM

## 2017-10-17 MED ORDER — LOSARTAN POTASSIUM 100 MG PO TABS: 100.0000 mg | ORAL_TABLET | Freq: Every day | ORAL | 3 refills | Status: DC

## 2017-10-24 DIAGNOSIS — M1711 Unilateral primary osteoarthritis, right knee: Secondary | ICD-10-CM | POA: Diagnosis not present

## 2017-10-31 DIAGNOSIS — R350 Frequency of micturition: Secondary | ICD-10-CM | POA: Diagnosis not present

## 2017-10-31 DIAGNOSIS — N401 Enlarged prostate with lower urinary tract symptoms: Secondary | ICD-10-CM | POA: Diagnosis not present

## 2017-10-31 DIAGNOSIS — N5201 Erectile dysfunction due to arterial insufficiency: Secondary | ICD-10-CM | POA: Diagnosis not present

## 2017-10-31 DIAGNOSIS — R972 Elevated prostate specific antigen [PSA]: Secondary | ICD-10-CM | POA: Diagnosis not present

## 2017-10-31 DIAGNOSIS — E291 Testicular hypofunction: Secondary | ICD-10-CM | POA: Diagnosis not present

## 2017-10-31 DIAGNOSIS — M1711 Unilateral primary osteoarthritis, right knee: Secondary | ICD-10-CM | POA: Diagnosis not present

## 2017-11-01 NOTE — Telephone Encounter (Signed)
I am going to email the manager at [facility] about this visit.  It looks like it was coded correctly when it was originally billed.  Thanks [employer]le

## 2017-11-07 DIAGNOSIS — M1711 Unilateral primary osteoarthritis, right knee: Secondary | ICD-10-CM | POA: Diagnosis not present

## 2017-11-14 DIAGNOSIS — M1711 Unilateral primary osteoarthritis, right knee: Secondary | ICD-10-CM | POA: Diagnosis not present

## 2017-11-14 DIAGNOSIS — E291 Testicular hypofunction: Secondary | ICD-10-CM | POA: Diagnosis not present

## 2017-11-14 DIAGNOSIS — R972 Elevated prostate specific antigen [PSA]: Secondary | ICD-10-CM | POA: Diagnosis not present

## 2017-12-04 ENCOUNTER — Other Ambulatory Visit: Payer: Self-pay | Admitting: Adult Health

## 2017-12-05 NOTE — Telephone Encounter (Signed)
Trulicity filled to pharmacy as requested. Please advise zolpidem.  Last OV: 09/06/17 Last filled: 06/28/17, #30, 2 RF Sig: Take 1 tablet (5 mg total) by mouth at bedtime as needed UDS: Not on file

## 2017-12-12 DIAGNOSIS — E119 Type 2 diabetes mellitus without complications: Secondary | ICD-10-CM | POA: Diagnosis not present

## 2017-12-12 DIAGNOSIS — H53032 Strabismic amblyopia, left eye: Secondary | ICD-10-CM | POA: Diagnosis not present

## 2017-12-12 LAB — HM DIABETES EYE EXAM

## 2017-12-22 ENCOUNTER — Encounter: Payer: Self-pay | Admitting: Family Medicine

## 2017-12-22 ENCOUNTER — Encounter: Payer: Self-pay | Admitting: Adult Health

## 2017-12-22 ENCOUNTER — Telehealth: Payer: Self-pay | Admitting: Adult Health

## 2017-12-22 MED ORDER — FREESTYLE TEST VI STRP: ORAL_STRIP | 3 refills | Status: DC

## 2017-12-22 MED ORDER — FREESTYLE LANCETS MISC: 3 refills | Status: AC

## 2017-12-22 MED ORDER — FREESTYLE SYSTEM KIT: 1.0000 | PACK | 0 refills | Status: DC | PRN

## 2017-12-22 NOTE — Telephone Encounter (Signed)
Copied from CRM [id number]. Topic: Quick Communication - See Telephone Encounter >> Dec 22, 2017  5:04 PM Landry MellowFoltz, Melissa J wrote: CRM for notification. See Telephone encounter for: 12/22/17.pharm called -  Pt received rx for freestyle meter - she can order freestyle lite, freestyle precision, freestyle freedom. The one that was prescribed is not able to be ordered.   Cb [phone] [id number]

## 2017-12-26 MED ORDER — FREESTYLE FREEDOM LITE W/DEVICE KIT: 1.0000 | PACK | Freq: Once | 0 refills | Status: AC

## 2017-12-26 NOTE — Telephone Encounter (Signed)
Rx for meter sent to the pharmacy.

## 2018-01-02 MED ORDER — FREESTYLE TEST VI STRP: ORAL_STRIP | 3 refills | Status: DC

## 2018-01-02 NOTE — Addendum Note (Signed)
Addended by: Raj JanusADKINS, Camyla Camposano T on: 01/02/2018 10:16 AM   Modules accepted: Orders

## 2018-01-16 ENCOUNTER — Ambulatory Visit: Payer: PPO | Attending: Adult Health | Admitting: Audiology

## 2018-01-16 DIAGNOSIS — R292 Abnormal reflex: Secondary | ICD-10-CM

## 2018-01-16 DIAGNOSIS — H93299 Other abnormal auditory perceptions, unspecified ear: Secondary | ICD-10-CM

## 2018-01-16 DIAGNOSIS — H918X1 Other specified hearing loss, right ear: Secondary | ICD-10-CM

## 2018-01-16 DIAGNOSIS — H748X1 Other specified disorders of right middle ear and mastoid: Secondary | ICD-10-CM | POA: Diagnosis not present

## 2018-01-16 DIAGNOSIS — IMO0001 Reserved for inherently not codable concepts without codable children: Secondary | ICD-10-CM

## 2018-01-16 DIAGNOSIS — H9193 Unspecified hearing loss, bilateral: Secondary | ICD-10-CM | POA: Diagnosis not present

## 2018-01-16 NOTE — Patient Instructions (Signed)
Recommendations:  1.  Follow-up with physician, possibly with an ENT referral because of the high frequency conductive component on the right side.  Improvement in the conductive component should improve hearing significantly. The right ear tympanic membrane compliance is shallow on the right side. In addition, there is significant ear wax bilaterally that is non-occluding.  2.  Consider hearing aid for the bilateral slight to mild hearing loss in the high frequencies that may create missing /s/,/th/./f/ and /k/  3.  Consider improvement of auditory processing and hearing in background noise using an at home computer based auditory processing program such as LACE by neurotone. Using this program 15 minutes, 4-5 days per week to improved decoding and hearing in background noise.  Hoyle SauerJacob Buccini, BA Graduate Student Clinician  Lewie Loroneborah Roselle Norton, AuD, CCC-A Doctor of Audiology

## 2018-01-16 NOTE — Procedures (Addendum)
[facility] [address] [place], [place] [address]  PCP:   Shirline Frees, NP Referred By:  Shirline Frees, NP Referral Reason: Hearing loss, unspecified hearing loss type, unspecified laterality  Patient Name: Bernard Roberts Patient DOB:  1951/02/07 Patient MRN:  [id number]  AUDIOLOGICAL EVALUATION  CASE HISTORY ARIE POWELL is a 67 y.o. male.  Jahlen presented concerns related to "hearing in a group."  Redding noted that he can hear others, but has difficulty understanding them at times.  He described his left ear as his better hearing ear.  Occasional tinnitus in the right and left ears.  Occasional dizziness and imbalance when getting out of bed and working out.  He denied loud noise exposure other than occasional concerts.  Familial history of hearing loss (i.e., mother) with use of hearing aids.  Xachary noted that he has diabetes.  No other concerns related to ears or hearing.  RESULTS Otoscopy: Otoscopy revealed non-occluding cerumen in the ear canal without visualization of the tympanic membrane, bilaterally.  Tympanometry: Tympanometric values for middle ear volume and pressure is within normal limits bilaterally. The left ear has normal tympanic membrane compliance (Type A) with the right ear showing shallow compliance (Type As).  Ipsilateral Acoustic Reflexes: Ipsilateral acoustic reflex thresholds were elevated and/or absent between [phone] and not within normal limits in the right ear, which is consistent with the degree of hearing loss but additional evaluation by an ENT is recommended to rule out retrocochlear pathology in the right ear.  Negative reflex decay bilaterally.  Ipsilateral acoustic reflex thresholds were 90dB between [phone] and are within normal limits in the left ear.  This is consistent with a normal acoustic reflex pathway up to the level of the auditory brainstem in the left ear.  The right ear has  elevated acoustic reflexes that are no response at 500Hz  and 4000Hz  and are 100dB from 1000Hz  - 2000Hz . Negative reflex decay bilaterally.  Pure Tone Audiometry: Pure tone audiometry revealed borderline normal hearing (15-25 dB HL) between [phone] Hz sloping to a mild-to-moderate asymmetrical sensorineural hearing loss (40-50 dB HL) between 3000-8000 Hz in the right ear.  The right ear high frequencies have a slight conductive component (15-dB air-bone gap) at 6000 Hz.  Borderline normal hearing (5-25 dB HL) between [phone] in the left ear.  Speech Audiometry: Speech audiometry revealed speech reception thresholds using recorded spondee word lists at 20 dB and 15 dB in the right and left ears, respectively.  There is good agreement with the pure tone averages.  Suprathreshold word recognition scores in quiet using recorded NU-6 word lists were 88% at 60 dB HL and 100% at 55 dB HL in the right and left ears, respectively.  The scores are within the expected ranges when compared to the pure tone averages.  Speech-in-noise scores using recorded multi-talker babble were 56% and 64% at +5 dB SNR in the right and left ears, respectively. Significant difficulty hearing in background noise is expected.  SUMMARY ARLANDER GILLEN has abnormal middle ear function on the right side with shallow tympanic membrane compliance that may be contributing to the right sided high frequency conductive component. Further evaluation by an ENT is recommended along with removal of the bilateral non-occluding ear wax.  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  Word recognition drops from excellent in quiet to poor in  minimal background noise bilaterally.  Loistine Chancehilip is a candidate for binaural amplification.  The overall test reliability was judged to be good.  RECOMMENDATIONS 1. 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  Consider careful management of non-occluding earwax in the right and left ears.  Contraindication is diabetes.  2. Consider binaural amplification for poor word recognition in minimal background noise in addition to the high frequency hearing loss that may be adversely affecting access to some sounds that provide clarity in speech, such as /s/, /th/, /f/, and /k/, especially in fluctuating noise.  Loistine Chancehilip deferred this option at this time.  3. Provided information about an at-home listening program (i.e., LACE by Neurotone) that targets difficulties with understanding speech in minimal background noise.  Ideal use is 15 minutes, 4-5 days per week to improve listening skills.  Loistine Chancehilip will follow up with this program at this time.  4. Consider referral to neuro rehabilitation for further evaluation and management of dizziness and imbalance.  Consistent with orthostatic hypotension.  5. Audiological re-evaluation per Shirline FreesNafziger, Cory, NP, or sooner if concerns arise, such as development or worsening of symptoms.  Hoyle SauerJacob Kathrin Folden, BA Graduate Student Clinician  Lewie Loroneborah Woodward, AuD, CCC-A Doctor of Audiology 01/16/2018  cc: Shirline FreesNafziger, Cory, NP

## 2018-01-17 DIAGNOSIS — N401 Enlarged prostate with lower urinary tract symptoms: Secondary | ICD-10-CM | POA: Diagnosis not present

## 2018-01-17 DIAGNOSIS — R350 Frequency of micturition: Secondary | ICD-10-CM | POA: Diagnosis not present

## 2018-01-18 ENCOUNTER — Ambulatory Visit: Payer: PPO | Admitting: Audiology

## 2018-01-28 ENCOUNTER — Other Ambulatory Visit: Payer: Self-pay | Admitting: Adult Health

## 2018-01-29 ENCOUNTER — Encounter: Payer: Self-pay | Admitting: Family Medicine

## 2018-01-29 NOTE — Telephone Encounter (Signed)
Now due for A1C follow up.  Left a message to call back and schedule follow up.

## 2018-02-01 ENCOUNTER — Encounter: Payer: Self-pay | Admitting: Adult Health

## 2018-02-01 NOTE — Telephone Encounter (Signed)
Per Kandee Keen, can send in 10 day supply.

## 2018-03-05 ENCOUNTER — Encounter: Payer: Self-pay | Admitting: Adult Health

## 2018-03-06 ENCOUNTER — Other Ambulatory Visit: Payer: Self-pay | Admitting: Adult Health

## 2018-03-06 MED ORDER — ZOLPIDEM TARTRATE 5 MG PO TABS: 5.0000 mg | ORAL_TABLET | Freq: Every evening | ORAL | 2 refills | Status: DC | PRN

## 2018-03-12 ENCOUNTER — Other Ambulatory Visit: Payer: Self-pay | Admitting: Adult Health

## 2018-03-12 ENCOUNTER — Encounter: Payer: Self-pay | Admitting: Adult Health

## 2018-03-13 NOTE — Telephone Encounter (Signed)
Ok to refill early 90 +2   Sent to the pharmacy by e-scribe.

## 2018-03-15 ENCOUNTER — Encounter (HOSPITAL_COMMUNITY): Payer: Self-pay

## 2018-03-15 ENCOUNTER — Ambulatory Visit (HOSPITAL_COMMUNITY)
Admission: EM | Admit: 2018-03-15 | Discharge: 2018-03-15 | Disposition: A | Discharge status: Home / Self Care | Payer: PPO | Attending: Emergency Medicine | Admitting: Emergency Medicine

## 2018-03-15 DIAGNOSIS — Z833 Family history of diabetes mellitus: Secondary | ICD-10-CM | POA: Diagnosis not present

## 2018-03-15 DIAGNOSIS — Z794 Long term (current) use of insulin: Secondary | ICD-10-CM | POA: Insufficient documentation

## 2018-03-15 DIAGNOSIS — Z888 Allergy status to other drugs, medicaments and biological substances status: Secondary | ICD-10-CM | POA: Diagnosis not present

## 2018-03-15 DIAGNOSIS — L03011 Cellulitis of right finger: Secondary | ICD-10-CM

## 2018-03-15 DIAGNOSIS — Z823 Family history of stroke: Secondary | ICD-10-CM | POA: Insufficient documentation

## 2018-03-15 DIAGNOSIS — E119 Type 2 diabetes mellitus without complications: Secondary | ICD-10-CM | POA: Insufficient documentation

## 2018-03-15 DIAGNOSIS — I10 Essential (primary) hypertension: Secondary | ICD-10-CM | POA: Diagnosis not present

## 2018-03-15 DIAGNOSIS — Z9889 Other specified postprocedural states: Secondary | ICD-10-CM | POA: Insufficient documentation

## 2018-03-15 DIAGNOSIS — Z8249 Family history of ischemic heart disease and other diseases of the circulatory system: Secondary | ICD-10-CM | POA: Insufficient documentation

## 2018-03-15 DIAGNOSIS — Z79899 Other long term (current) drug therapy: Secondary | ICD-10-CM | POA: Insufficient documentation

## 2018-03-15 DIAGNOSIS — E785 Hyperlipidemia, unspecified: Secondary | ICD-10-CM | POA: Insufficient documentation

## 2018-03-15 DIAGNOSIS — Z811 Family history of alcohol abuse and dependence: Secondary | ICD-10-CM | POA: Insufficient documentation

## 2018-03-15 DIAGNOSIS — Z792 Long term (current) use of antibiotics: Secondary | ICD-10-CM | POA: Diagnosis not present

## 2018-03-15 DIAGNOSIS — Z7982 Long term (current) use of aspirin: Secondary | ICD-10-CM | POA: Diagnosis not present

## 2018-03-15 DIAGNOSIS — Z7984 Long term (current) use of oral hypoglycemic drugs: Secondary | ICD-10-CM | POA: Diagnosis not present

## 2018-03-15 DIAGNOSIS — Z818 Family history of other mental and behavioral disorders: Secondary | ICD-10-CM | POA: Diagnosis not present

## 2018-03-15 MED ORDER — LIDOCAINE HCL 2 % IJ SOLN
INTRAMUSCULAR | Status: AC
  Filled 2018-03-15: qty 20

## 2018-03-15 MED ORDER — CEPHALEXIN 500 MG PO CAPS: 1000.0000 mg | ORAL_CAPSULE | Freq: Two times a day (BID) | ORAL | 0 refills | Status: AC

## 2018-03-15 MED ORDER — IBUPROFEN 600 MG PO TABS: 600.0000 mg | ORAL_TABLET | Freq: Four times a day (QID) | ORAL | 0 refills | Status: AC | PRN

## 2018-03-15 NOTE — Discharge Instructions (Addendum)
600 mg of ibuprofen with 1 g of Tylenol 3-4 times a day as needed for pain.  Finish the antibiotics, even if you feel better.  We will contact you if we need to change your antibiotics.

## 2018-03-15 NOTE — ED Provider Notes (Signed)
HPI  SUBJECTIVE:  Bernard Roberts is a left-handed 67 y.o. male who presents with constant dull, throbbing pain, erythema, swelling along the medial nail fold of his right thumb starting several days ago.  He states that the pain now extends to the joint.  Admits to biting his nails.  He states that he also recently peeled back the nail.  No fevers, body aches.  He has been soaking it in warm water and applying antibiotic ointment without improvement of symptoms.  Symptoms are worse with palpation and applying pressure.  He has a past medical history of diabetes.  No history of MRSA.  UEA:[id number], Kandee Keen, NP   Past Medical History:  Diagnosis Date  . Diabetes mellitus without complication (HCC)   . Elevated PSA    Around 14   . Gout   . Hyperlipidemia   . Hypertension     Past Surgical History:  Procedure Laterality Date  . KNEE ARTHROPLASTY     unknown which side    Family History  Problem Relation Age of Onset  . Diabetes Father   . Heart disease Father   . Hypertension Father   . Stroke Father   . Diabetes Sister   . Mental illness Sister        Parnoid   . Alcohol abuse Brother   . Hypertension Brother     Social History   Tobacco Use  . Smoking status: Never Smoker  . Smokeless tobacco: Never Used  Substance Use Topics  . Alcohol use: Yes    Alcohol/week: 0.0 oz    Comment: 3-4 week  . Drug use: No    No current facility-administered medications for this encounter.   Current Outpatient Medications:  .  canagliflozin (INVOKANA) 300 MG TABS tablet, Take 1 tablet (300 mg total) by mouth daily before breakfast., Disp: 90 tablet, Rfl: 3 .  FREESTYLE TEST STRIPS test strip, USE TO TEST BLOOD GLUCOSE ONCE DAILY, Disp: 100 each, Rfl: 3 .  losartan (COZAAR) 100 MG tablet, Take 1 tablet (100 mg total) by mouth daily., Disp: 90 tablet, Rfl: 3 .  TRULICITY 1.5 MG/0.5ML SOPN, INJECT 1.5MG  INTO THE SKIN ONCE A WEEK, Disp: 1 pen, Rfl: 10 .  aspirin EC 81 MG tablet, Take 81  mg by mouth daily., Disp: , Rfl:  .  cephALEXin (KEFLEX) 500 MG capsule, Take 2 capsules (1,000 mg total) by mouth 2 (two) times daily for 5 days., Disp: 20 capsule, Rfl: 0 .  clonazePAM (KLONOPIN) 1 MG tablet, Take 0.5 to 1 pill before flight, Disp: 8 tablet, Rfl: 0 .  ibuprofen (ADVIL,MOTRIN) 600 MG tablet, Take 1 tablet (600 mg total) by mouth every 6 (six) hours as needed., Disp: 30 tablet, Rfl: 0 .  Lancets (FREESTYLE) lancets, USE TO TEST BLOOD GLUCOSE ONCE DAILY, Disp: 100 each, Rfl: 3 .  metFORMIN (GLUCOPHAGE-XR) 500 MG 24 hr tablet, TAKE 2 TABLETS BY MOUTH EVERY MORNING AND TAKE 3 TABLETS BY MOUTH EVERY EVENING, Disp: 50 tablet, Rfl: 0 .  sildenafil (VIAGRA) 50 MG tablet, Take 1-2 tablets as needed, Disp: 30 tablet, Rfl: 6 .  tamsulosin (FLOMAX) 0.4 MG CAPS capsule, Take 1 capsule (0.4 mg total) by mouth daily., Disp: 90 capsule, Rfl: 3 .  testosterone cypionate (DEPOTESTOSTERONE CYPIONATE) 200 MG/ML injection, Inject 200 mg into the muscle every 14 (fourteen) days., Disp: , Rfl:  .  zolpidem (AMBIEN) 5 MG tablet, Take 1 tablet (5 mg total) by mouth at bedtime as needed., Disp: 30 tablet, Rfl:  2  Allergies  Allergen Reactions  . Victoza [Liraglutide] Nausea And Vomiting     ROS  As noted in HPI.   Physical Exam  BP 135/60   Pulse 79   Temp 97.7 F (36.5 C)   Resp 18   SpO2 96%   Constitutional: Well developed, well nourished, no acute distress Eyes:  EOMI, conjunctiva normal bilaterally HENT: Normocephalic, atraumatic,mucus membranes moist Respiratory: Normal inspiratory effort Cardiovascular: Normal rate GI: nondistended skin: No rash, skin intact Musculoskeletal: Tender paronychia medial right thumb with what appears to be a collection of pus.  Positive mild erythema.  No tenderness over the flexor tendons.  Mild tenderness over the IP joint.  No evidence of felon.     Neurologic: Alert & oriented x 3, no focal neuro deficits Psychiatric: Speech and behavior  appropriate   ED Course   Medications - No data to display  Orders Placed This Encounter  Procedures  . Wound or Superficial Culture    Right thumb paronychia    Standing Status:   Standing    Number of Occurrences:   1    Order Specific Question:   Patient immune status    Answer:   Normal    No results found for this or any previous visit (from the past 24 hour(s)). No results found.  ED Clinical Impression  Paronychia of thumb, right   ED Assessment/Plan  Procedure note: Had patient thoroughly wash hands with soap and water.  Then cleaned the base of the thumb with alcohol and did a digital block with 2% plain lidocaine 0.5 cc with complete anesthesia.  Then using a 11 blade, made a single stab incision with expression of pus.  Cultures sent.  Bacitracin and Band-Aid placed.  Patient tolerated procedure well.  Will send off culture to confirm antibiotic choice.  Home with Keflex thousand milligrams p.o. twice daily for 5 days, ibuprofen 600 mg to take with 1 g of Tylenol 3 or 4 times a day.  Follow-up with PMD as needed.  To the ER if he gets worse.  Will contact patient if we need to change antibiotics.  Discussed MDM, treatment plan, and plan for follow-up with patient. Discussed sn/sx that should prompt return to the ED. patient agrees with plan.   Meds ordered this encounter  Medications  . cephALEXin (KEFLEX) 500 MG capsule    Sig: Take 2 capsules (1,000 mg total) by mouth 2 (two) times daily for 5 days.    Dispense:  20 capsule    Refill:  0  . ibuprofen (ADVIL,MOTRIN) 600 MG tablet    Sig: Take 1 tablet (600 mg total) by mouth every 6 (six) hours as needed.    Dispense:  30 tablet    Refill:  0    *This clinic note was created using Scientist, clinical (histocompatibility and immunogenetics)Dragon dictation software. Therefore, there may be occasional mistakes despite careful proofreading.   ?   Domenick GongMortenson, Lilliane Sposito, MD 03/15/18 1104

## 2018-03-15 NOTE — ED Triage Notes (Signed)
Pt presents with complaints of having problems with his thumb nail x 3 days. Reports using antibiotic ointment.

## 2018-03-17 LAB — AEROBIC CULTURE W GRAM STAIN (SUPERFICIAL SPECIMEN)

## 2018-03-17 LAB — AEROBIC CULTURE  (SUPERFICIAL SPECIMEN): GRAM STAIN: NONE SEEN

## 2018-03-21 ENCOUNTER — Telehealth (HOSPITAL_COMMUNITY): Payer: Self-pay

## 2018-03-21 NOTE — Telephone Encounter (Signed)
Attempted to reach patient to go over results and Dr. Chaney MallingMortenson instruction to assess need for another antibiotic. No answer at this time. Voicemail left.

## 2018-04-03 ENCOUNTER — Encounter: Payer: Self-pay | Admitting: Adult Health

## 2018-04-03 ENCOUNTER — Other Ambulatory Visit: Payer: Self-pay | Admitting: Endocrinology

## 2018-04-03 ENCOUNTER — Encounter: Payer: Self-pay | Admitting: Endocrinology

## 2018-04-03 MED ORDER — METFORMIN HCL ER 500 MG PO TB24: ORAL_TABLET | ORAL | 2 refills | Status: DC

## 2018-04-04 ENCOUNTER — Encounter: Payer: Self-pay | Admitting: Endocrinology

## 2018-04-04 ENCOUNTER — Other Ambulatory Visit: Payer: Self-pay | Admitting: Adult Health

## 2018-04-04 ENCOUNTER — Ambulatory Visit (INDEPENDENT_AMBULATORY_CARE_PROVIDER_SITE_OTHER): Payer: PPO | Admitting: Endocrinology

## 2018-04-04 ENCOUNTER — Encounter: Payer: Self-pay | Admitting: Adult Health

## 2018-04-04 VITALS — BP 116/78 | HR 85 | Wt 192.4 lb

## 2018-04-04 DIAGNOSIS — E119 Type 2 diabetes mellitus without complications: Secondary | ICD-10-CM | POA: Diagnosis not present

## 2018-04-04 DIAGNOSIS — F40243 Fear of flying: Secondary | ICD-10-CM

## 2018-04-04 DIAGNOSIS — Z794 Long term (current) use of insulin: Secondary | ICD-10-CM

## 2018-04-04 LAB — POCT GLYCOSYLATED HEMOGLOBIN (HGB A1C): Hemoglobin A1C: 11.3 % — AB (ref 4.0–5.6)

## 2018-04-04 MED ORDER — INSULIN GLARGINE 100 UNIT/ML SOLOSTAR PEN: 20.0000 [IU] | PEN_INJECTOR | SUBCUTANEOUS | 99 refills | Status: DC

## 2018-04-04 NOTE — Patient Instructions (Addendum)
check your blood sugar once a day.  vary the time of day when you check, between before the 3 meals, and at bedtime.  also check if you have symptoms of your blood sugar being too high or too low.  please keep a record of the readings and bring it to your next appointment here (or you can bring the meter itself).  You can write it on any piece of paper.  please call us sooner if your blood sugar goes below 70, or if you have a lot of readings over 200. I have sent a prescription to your pharmacy, to add lantus.  Please call or message us next week, to tell us how the blood sugar is doing Please continue the same other diabetes medications Please come back for a follow-up appointment in 2 months.

## 2018-04-04 NOTE — Telephone Encounter (Signed)
Copied from CRM [id number]. Topic: Quick Communication - See Telephone Encounter >> Apr 04, 2018  9:42 AM Windy KalataMichael, Bernard Roberts, NT wrote: CRM for notification. See Telephone encounter for: 04/04/18.  Patient is calling and states he would like a refill on clonazePAM (KLONOPIN) 1 MG tablet. He is flying on Sunday 04/08/18 and was instructed to contact the office and speak with a live person to expedite this process. Please advise.   [facility] [place], [place] - [address] [place] [address] Phone: [phone] Fax: [phone]

## 2018-04-04 NOTE — Telephone Encounter (Signed)
Dr. Clent RidgesFry,  Pt is traveling this week.  Can you send this in electronically?

## 2018-04-04 NOTE — Telephone Encounter (Signed)
[name],  I sent the request to Dr. Clent RidgesFry.  Can you look after this please?

## 2018-04-04 NOTE — Progress Notes (Signed)
Subjective:    Patient ID: Bernard Roberts Lookabaugh, male    DOB: [birth year], 67 y.o.   MRN: [id number]  HPI  Pt returns for f/u of diabetes mellitus: DM type: 2 Dx'ed: 1998 Complications: polyneuropathy Therapy: trulicity and 2 oral meds DKA: never Severe hypoglycemia: never Pancreatitis: never Pancreatic imaging: never Other: he took insulin approx 2014-2017 Interval history: pt states he feels well in general.  He says diet and exercise are worse recently.  He takes meds as rx'ed.   Past Medical History:  Diagnosis Date  . Diabetes mellitus without complication (HCC)   . Elevated PSA    Around 15   . Gout   . Hyperlipidemia   . Hypertension     Past Surgical History:  Procedure Laterality Date  . KNEE ARTHROPLASTY     unknown which side    Social History   Socioeconomic History  . Marital status: Married    Spouse name: Not on file  . Number of children: Not on file  . Years of education: Not on file  . Highest education level: Not on file  Occupational History  . Not on file  Social Needs  . Financial resource strain: Not on file  . Food insecurity:    Worry: Not on file    Inability: Not on file  . Transportation needs:    Medical: Not on file    Non-medical: Not on file  Tobacco Use  . Smoking status: Never Smoker  . Smokeless tobacco: Never Used  Substance and Sexual Activity  . Alcohol use: Yes    Alcohol/week: 0.0 oz    Comment: 3-4 week  . Drug use: No  . Sexual activity: Not on file  Lifestyle  . Physical activity:    Days per week: Not on file    Minutes per session: Not on file  . Stress: Not on file  Relationships  . Social connections:    Talks on phone: Not on file    Gets together: Not on file    Attends religious service: Not on file    Active member of club or organization: Not on file    Attends meetings of clubs or organizations: Not on file    Relationship status: Not on file  . Intimate partner violence:    Fear of current or ex  partner: Not on file    Emotionally abused: Not on file    Physically abused: Not on file    Forced sexual activity: Not on file  Other Topics Concern  . Not on file  Social History Narrative   Works for as  [employer]    He likes to swim, write, hike.     Current Outpatient Medications on File Prior to Visit  Medication Sig Dispense Refill  . aspirin EC 81 MG tablet Take 81 mg by mouth daily.    . canagliflozin (INVOKANA) 300 MG TABS tablet Take 1 tablet (300 mg total) by mouth daily before breakfast. 90 tablet 3  . FREESTYLE TEST STRIPS test strip USE TO TEST BLOOD GLUCOSE ONCE DAILY 100 each 3  . ibuprofen (ADVIL,MOTRIN) 600 MG tablet Take 1 tablet (600 mg total) by mouth every 6 (six) hours as needed. 30 tablet 0  . Lancets (FREESTYLE) lancets USE TO TEST BLOOD GLUCOSE ONCE DAILY 100 each 3  . metFORMIN (GLUCOPHAGE-XR) 500 MG 24 hr tablet TAKE 2 TABLETS BY MOUTH EVERY MORNING AND TAKE 3 TABLETS BY MOUTH EVERY EVENING 150 tablet 2  . sildenafil (  VIAGRA) 50 MG tablet Take 1-2 tablets as needed 30 tablet 6  . tamsulosin (FLOMAX) 0.4 MG CAPS capsule Take 1 capsule (0.4 mg total) by mouth daily. 90 capsule 3  . testosterone cypionate (DEPOTESTOSTERONE CYPIONATE) 200 MG/ML injection Inject 200 mg into the muscle every 14 (fourteen) days.    . TRULICITY 1.5 MG/0.5ML SOPN INJECT 1.5MG  INTO THE SKIN ONCE A WEEK 1 pen 10  . zolpidem (AMBIEN) 5 MG tablet Take 1 tablet (5 mg total) by mouth at bedtime as needed. 30 tablet 2  . losartan (COZAAR) 100 MG tablet Take 1 tablet (100 mg total) by mouth daily. (Patient not taking: Reported on 04/04/2018) 90 tablet 3   No current facility-administered medications on file prior to visit.     Allergies  Allergen Reactions  . Victoza [Liraglutide] Nausea And Vomiting    Family History  Problem Relation Age of Onset  . Diabetes Father   . Heart disease Father   . Hypertension Father   . Stroke Father   . Diabetes Sister   . Mental illness Sister         Parnoid   . Alcohol abuse Brother   . Hypertension Brother     BP 116/78 (BP Location: Left Arm, Patient Position: Sitting, Cuff Size: Normal)   Pulse 85   Wt 192 lb 6.4 oz (87.3 kg)   SpO2 97%   BMI 29.25 kg/Roberts    Review of Systems He denies hypoglycemia    Objective:   Physical Exam VITAL SIGNS:  See vs page GENERAL: no distress Pulses: foot pulses are intact bilaterally.   MSK: no deformity of the feet or ankles.  CV: no edema of the legs Skin:  no ulcer on the feet or ankles.  normal color and temp on the feet and ankles Neuro: sensation is intact to touch on the feet and ankles.   Ext: There is bilateral onychomycosis of the toenails.       Assessment & Plan:  Type 2 DM: much worse.  We discussed.  He'll resume insulin, but he declines multiple daily injections.     Patient Instructions  check your blood sugar once a day.  vary the time of day when you check, between before the 3 meals, and at bedtime.  also check if you have symptoms of your blood sugar being too high or too low.  please keep a record of the readings and bring it to your next appointment here (or you can bring the meter itself).  You can write it on any piece of paper.  please call us sooner if your blood sugar goes below 70, or if you have a lot of readings over 200. I have sent a prescription to your pharmacy, to add lantus.  Please call or message [place] next week, to tell us how the blood sugar is doing Please continue the same other diabetes medications Please come back for a follow-up appointment in 2 months.

## 2018-04-06 ENCOUNTER — Encounter: Payer: Self-pay | Admitting: Adult Health

## 2018-04-06 ENCOUNTER — Telehealth: Payer: Self-pay | Admitting: Adult Health

## 2018-04-06 MED ORDER — CLONAZEPAM 1 MG PO TABS: ORAL_TABLET | ORAL | 0 refills | Status: DC

## 2018-04-06 NOTE — Telephone Encounter (Signed)
Call in 10 tablets to use for flying, no rf

## 2018-04-06 NOTE — Telephone Encounter (Signed)
   Nelwyn SalisburyFry, Stephen A, MD 10:08 AM Note  Call in 10 tablets to use for flying, no rf       See refill encounter. This has been taken care of and sent to pharmacy. Nothing further needed.

## 2018-04-06 NOTE — Telephone Encounter (Signed)
Per Dr. Clent RidgesFry, medication was sent electronically. No further action required.  Message sent to the pt by MyChart.

## 2018-04-06 NOTE — Telephone Encounter (Signed)
Rx done. 

## 2018-04-06 NOTE — Telephone Encounter (Signed)
Copied from CRM [phone]. Topic: General - Other >> Apr 06, 2018 12:04 PM Leafy Roobinson, Norma J wrote: Reason for CRM:pt is calling and needs clonazepam 1 mg sent to [facility] on [address]. Pt is out. Pt is going out of town on sunday

## 2018-04-06 NOTE — Telephone Encounter (Signed)
Done. I sent them electronically. Ignore my earlier note about calling them in

## 2018-04-06 NOTE — Telephone Encounter (Signed)
See refill request 04/04/18 Pt calling again, states that he needs this refilled ASAP Please advise Dr Clent RidgesFry -- Refill request is in your box.

## 2018-05-02 ENCOUNTER — Ambulatory Visit: Payer: PPO

## 2018-05-09 DIAGNOSIS — R972 Elevated prostate specific antigen [PSA]: Secondary | ICD-10-CM | POA: Diagnosis not present

## 2018-05-09 DIAGNOSIS — E291 Testicular hypofunction: Secondary | ICD-10-CM | POA: Diagnosis not present

## 2018-05-16 ENCOUNTER — Ambulatory Visit (INDEPENDENT_AMBULATORY_CARE_PROVIDER_SITE_OTHER): Payer: PPO

## 2018-05-16 VITALS — BP 104/70 | HR 77 | Ht 68.0 in | Wt 201.0 lb

## 2018-05-16 DIAGNOSIS — Z0000 Encounter for general adult medical examination without abnormal findings: Secondary | ICD-10-CM

## 2018-05-16 DIAGNOSIS — Z23 Encounter for immunization: Secondary | ICD-10-CM

## 2018-05-16 NOTE — Progress Notes (Addendum)
Subjective:   Bernard Roberts is a 67 y.o. male who presents for an Initial Medicare Annual Wellness Visit.  Reports health as good  Dr. 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 Place went out of business. Benefits BS, trig go down, good for heart;    Eats carbs now  Rice, bread, cookies, pasta    Exercise swims and walks Goes to [employer]; uses their machines for upper body Goes 3 or 4 times swimming 30 minutes;   States he does not sleep well; sleeps 6 to 7 hours  Sleeps sitting up  Day time drowsiness    Health Maintenance Due  Topic Date Due  . COLONOSCOPY  03/28/2018  . INFLUENZA VACCINE  05/03/2018   [place] in [place]  Colonoscopy has had one within 10 years  Cardiac Risk Factors include: advanced age (>8055men, 29>65 women);dyslipidemia;[facility]  Is weaning off trulicity as he is in the doughnut   Will take his PSV 23  vaccine today     Objective:    Today's Vitals   05/16/18 1300  BP: 104/70  Pulse: 77  SpO2: 96%  Weight: 201 lb (91.2 kg)  Height: 5\' 8"  (1.727 m)   Body mass index is 30.56 kg/m.  Advanced Directives 05/16/2018 04/06/2017  Does Patient Have a Medical Advance Directive? No No   Not kno Current Medications (verified) Outpatient Encounter Medications as of 05/16/2018  Medication Sig  . aspirin EC 81 MG tablet Take 81 mg by mouth daily.  . canagliflozin (INVOKANA) 300 MG  TABS tablet Take 1 tablet (300 mg total) by mouth daily before breakfast.  . clonazePAM (KLONOPIN) 1 MG tablet Take 0.5 to 1 pill before flight  . finasteride (PROSCAR) 5 MG tablet Take 5 mg by mouth daily.  Marland Kitchen. FREESTYLE TEST STRIPS test strip USE TO TEST BLOOD GLUCOSE ONCE DAILY  . ibuprofen (ADVIL,MOTRIN) 600 MG tablet Take 1 tablet (600 mg total) by mouth every 6 (six) hours as needed.  . Insulin Glargine (LANTUS SOLOSTAR) 100 UNIT/ML Solostar Pen Inject 20 Units into the skin every morning. And pen needles 1/day  . Lancets (FREESTYLE) lancets USE TO TEST BLOOD GLUCOSE ONCE DAILY  . losartan (COZAAR) 100 MG tablet Take 1 tablet (100 mg total) by mouth daily.  . metFORMIN (GLUCOPHAGE-XR) 500 MG 24 hr tablet TAKE 2 TABLETS BY MOUTH EVERY MORNING AND TAKE 3 TABLETS BY MOUTH EVERY EVENING  . sildenafil (VIAGRA) 50 MG tablet Take 1-2 tablets as needed  . tamsulosin (FLOMAX) 0.4 MG CAPS capsule Take 1 capsule (0.4 mg total) by mouth daily.  Marland Kitchen. testosterone cypionate (DEPOTESTOSTERONE CYPIONATE) 200 MG/ML injection Inject 200 mg into the muscle every 14 (fourteen) days.  Marland Kitchen. zolpidem (AMBIEN) 5 MG tablet Take 1 tablet (5 mg total) by  mouth at bedtime as needed.  . clonazePAM (KLONOPIN) 1 MG tablet Take 0.5 to 1 pill before flight (Patient not taking: Reported on 05/16/2018)  . TRULICITY 1.5 MG/0.5ML SOPN INJECT 1.5MG  INTO THE SKIN ONCE A WEEK (Patient not taking: Reported on 05/16/2018)   No facility-administered encounter medications on file as of 05/16/2018.     Allergies (verified) Victoza [liraglutide]   History: Past Medical History:  Diagnosis Date  . Diabetes mellitus without complication (HCC)   . Elevated PSA    Around 15   . Gout   . Hyperlipidemia   . Hypertension    Past Surgical History:  Procedure Laterality Date  . KNEE ARTHROPLASTY     unknown which side   Family History  Problem Relation Age of Onset  . Diabetes Father   . Heart disease Father   . Hypertension Father     . Stroke Father   . Diabetes Sister   . Mental illness Sister        Parnoid   . Alcohol abuse Brother   . Hypertension Brother    Social History   Socioeconomic History  . Marital status: Married    Spouse name: Not on file  . Number of children: Not on file  . Years of education: Not on file  . Highest education level: Not on file  Occupational History  . Not on file  Social Needs  . Financial resource strain: Not on file  . Food insecurity:    Worry: Not on file    Inability: Not on file  . Transportation needs:    Medical: Not on file    Non-medical: Not on file  Tobacco Use  . Smoking status: Never Smoker  . Smokeless tobacco: Never Used  . Tobacco comment: smoked over 100 cigerettes per lifetime  Substance and Sexual Activity  . Alcohol use: Yes    Alcohol/week: 0.0 standard drinks    Comment: not a drinker 3 to 4 a week   . Drug use: No  . Sexual activity: Not on file  Lifestyle  . Physical activity:    Days per week: Not on file    Minutes per session: Not on file  . Stress: Not on file  Relationships  . Social connections:    Talks on phone: Not on file    Gets together: Not on file    Attends religious service: Not on file    Active member of club or organization: Not on file    Attends meetings of clubs or organizations: Not on file    Relationship status: Not on file  Other Topics Concern  . Not on file  Social History Narrative   Works for as  [employer]    He likes to swim, write, hike.    Tobacco Counseling Counseling given: Yes Comment: smoked over 100 cigerettes per lifetime   Clinical Intake:                       Activities of Daily Living In your present state of health, do you have any difficulty performing the following activities: 05/16/2018  Hearing? Y  Vision? N  Difficulty concentrating or making decisions? N  Walking or climbing stairs? N  Dressing or bathing? N  Doing errands, shopping? N  Preparing Food and  eating ? N  Using the Toilet? N  In the past six months, have you accidently leaked urine? Y  Comment prostate and bladder   Do you  have problems with loss of bowel control? N  Managing your Medications? N  Managing your Finances? N  Housekeeping or managing your Housekeeping? N  Some recent data might be hidden     Immunizations and Health Maintenance Immunization History  Administered Date(s) Administered  . Influenza, High Dose Seasonal PF 11/18/2016, 09/06/2017  . Pneumococcal Conjugate-13 11/18/2016  . Pneumococcal Polysaccharide-23 05/16/2018  . Tdap 11/18/2016  . Zoster Recombinat (Shingrix) 01/20/2017, 05/30/2017   Health Maintenance Due  Topic Date Due  . COLONOSCOPY  03/28/2018  . INFLUENZA VACCINE  05/03/2018    Patient Care Team: Shirline FreesNafziger, Cory, NP as PCP - General (Family Medicine)  Indicate any recent Medical Services you may have received from other than [facility] providers in the past year (date may be approximate).    Assessment:   This is a routine wellness examination for Bernard Roberts.  Hearing/Vision screen Hearing Screening Comments: Was seen by audiology but not ENT Vision Screening Comments: Vision annually Practice on  [address] No diabetic retinopathy  Dietary issues and exercise activities discussed: Current Exercise Habits: Structured exercise class;Home exercise routine, Time (Minutes): 60, Frequency (Times/Week): 2, Weekly Exercise (Minutes/Week): 120  Goals    . Patient Stated     Weight loss / <180 Give up sweets;  Start prepping food       Depression Screen PHQ 2/9 Scores 05/16/2018 06/09/2017 06/03/2016 05/21/2016  PHQ - 2 Score 0 0 0 0    Fall Risk Fall Risk  05/16/2018 06/09/2017 06/03/2016 05/21/2016  Falls in the past year? No Yes No No  Number falls in past yr: - 1 - -      Cognitive Function: MMSE - Mini Mental State Exam 05/16/2018  Not completed: (No Data)   Ad8 score reviewed for issues:  Issues making decisions:  Less interest  in hobbies / activities:  Repeats questions, stories (family complaining):  Trouble using ordinary gadgets (microwave, computer, phone):  Forgets the month or year:   Mismanaging finances:   Remembering appts:  Daily problems with thinking and/or memory: Ad8 score is=0           Screening Tests Health Maintenance  Topic Date Due  . COLONOSCOPY  03/28/2018  . INFLUENZA VACCINE  05/03/2018  . HEMOGLOBIN A1C  10/05/2018  . OPHTHALMOLOGY EXAM  12/13/2018  . FOOT EXAM  04/05/2019  . TETANUS/TDAP  11/18/2026  . Hepatitis C Screening  Completed  . PNA vac Low Risk Adult  Completed         Plan:    PCP Notes   Health Maintenance  Living in [place]  Colonoscopy appears due but states he had one within 10 years and it was normal. Agrees to locate the name of the practice and can complete the form to request records here at his next OV    Goes to [facility] for PSA fup; states his last reading was 4-5;   Will take his PSV 23  vaccine today    Abnormal Screens  None;  Dr. Everardo AllEllison following A1c and dm; A1c was 11.3 from 6.4 in 05/2016 anfd 9.6 in 06/2017 with high triglycerides. Was traveling and ate more sweets. Is now considering a keto diet in which hs BS was well maintained. Motivation low ? 2dary to fatigue to be evaluated by Lennox Grumblesory N  Referrals  None  [facility], but they were suppose to call him back and they did not. Dewaine CongerGave Bernard Roberts the number to fup.  Patient concerns; Feels tired all the time  Discussed high bs can equate to fatigue because his body is unable to utilize the blood sugar for energy.  Takes testosterone   Following PSA 17  And now down to 3.5 to 4.5 per his report.  It has always been high but neg 3 bx   Nurse Concerns; Fasting was 180 and recommended he call Dr. Everardo All to inform him but states he will discuss at his upcoming apt with him.  Is weaning off trulicity as he is in the doughnut  and can't afford it. Reporting he is taking other meds.   States he is fatigued; looks tired today. Does not feel he has sleep apnea but will discuss sleep issues with Lennox Grumbles and will make an apt today. States father had sleep issues  Next PCP apt To be schedule for blood work and fup regarding fatigue. Will also schedule CPE after 9/5     I have personally reviewed and noted the following in the patient's chart:   . Medical and social history . Use of alcohol, tobacco or illicit drugs  . Current medications and supplements . Functional ability and status . Nutritional status . Physical activity . Advanced directives . List of other physicians . Hospitalizations, surgeries, and ER visits in previous 12 months . Vitals . Screenings to include cognitive, depression, and falls . Referrals and appointments  In addition, I have reviewed and discussed with patient certain preventive protocols, quality metrics, and best practice recommendations. A written personalized care plan for preventive services as well as general preventive health recommendations were provided to patient.     Columbia Pandey, RN   05/16/2018   I have reviewed the documentation for the AWV and Advanced Care Planning provided by the health coach and agree with their documentation. I was immediately available for any questions  Kristian Covey MD  Primary Care at [facility]

## 2018-05-16 NOTE — Patient Instructions (Addendum)
Bernard Roberts , Thank you for taking time to come for your Medicare Wellness Visit. I appreciate your ongoing commitment to your health goals. Please review the following plan we discussed and let me know if I can assist you in the future.   [facility] to discuss sleep issues Will also discuss diabetes  Call the audiology [facility] Address: [address], [place], [place] [address] Phone: [phone]   Deaf & Hard of Hearing Division Services - can assist with hearing aid x 1  No reviews  [facility]  [address]  [phone] - Vance Peperenise Hayes  [email or web address]   These are the goals we discussed: Goals    . Patient Stated     Weight loss / <180 Give up sweets;  Start prepping food        This is a list of the screening recommended for you and due dates:  Health Maintenance  Topic Date Due  . Pneumonia vaccines (2 of 2 - PPSV23) 11/18/2017  . Colon Cancer Screening  03/28/2018  . Flu Shot  05/03/2018  . Hemoglobin A1C  10/05/2018  . Eye exam for diabetics  12/13/2018  . Complete foot exam   04/05/2019  . Tetanus Vaccine  11/18/2026  .  Hepatitis C: One time screening is recommended by Center for Disease Control  (CDC) for  adults born from [birth year] through 1965.   Completed      Fall Prevention in the Home Falls can cause injuries. They can happen to people of all ages. There are many things you can do to make your home safe and to help prevent falls. What can I do on the outside of my home?  Regularly fix the edges of walkways and driveways and fix any cracks.  Remove anything that might make you trip as you walk through a door, such as a raised step or threshold.  Trim any bushes or trees on the path to your home.  Use bright outdoor lighting.  Clear any walking paths of anything that might make someone trip, such as rocks  or tools.  Regularly check to see if handrails are loose or broken. Make sure that both sides of any steps have handrails.  Any raised decks and porches should have guardrails on the edges.  Have any leaves, snow, or ice cleared regularly.  Use sand or salt on walking paths during winter.  Clean up any spills in your garage right away. This includes oil or grease spills. What can I do in the bathroom?  Use night lights.  Install grab bars by the toilet and in the tub and shower. Do not use towel bars as grab bars.  Use non-skid mats or decals in the tub or shower.  If you need to sit down in the shower, use a plastic, non-slip stool.  Keep the floor dry. Clean up any water that spills on the floor as soon as it happens.  Remove soap buildup in the tub or shower regularly.  Attach bath mats securely with double-sided non-slip rug tape.  Do not have throw rugs and other things on the floor that can make you trip. What can I do in the bedroom?  Use night lights.  Make sure that you have a light by your bed that is easy to reach.  Do not use any sheets or blankets that are too big for your bed. They should not hang down onto the  floor.  Have a firm chair that has side arms. You can use this for support while you get dressed.  Do not have throw rugs and other things on the floor that can make you trip. What can I do in the kitchen?  Clean up any spills right away.  Avoid walking on wet floors.  Keep items that you use a lot in easy-to-reach places.  If you need to reach something above you, use a strong step stool that has a grab bar.  Keep electrical cords out of the way.  Do not use floor polish or wax that makes floors slippery. If you must use wax, use non-skid floor wax.  Do not have throw rugs and other things on the floor that can make you trip. What can I do with my stairs?  Do not leave any items on the stairs.  Make sure that there are handrails on both  sides of the stairs and use them. Fix handrails that are broken or loose. Make sure that handrails are as long as the stairways.  Check any carpeting to make sure that it is firmly attached to the stairs. Fix any carpet that is loose or worn.  Avoid having throw rugs at the top or bottom of the stairs. If you do have throw rugs, attach them to the floor with carpet tape.  Make sure that you have a light switch at the top of the stairs and the bottom of the stairs. If you do not have them, ask someone to add them for you. What else can I do to help prevent falls?  Wear shoes that: ? Do not have high heels. ? Have rubber bottoms. ? Are comfortable and fit you well. ? Are closed at the toe. Do not wear sandals.  If you use a stepladder: ? Make sure that it is fully opened. Do not climb a closed stepladder. ? Make sure that both sides of the stepladder are locked into place. ? Ask someone to hold it for you, if possible.  Clearly mark and make sure that you can see: ? Any grab bars or handrails. ? First and last steps. ? Where the edge of each step is.  Use tools that help you move around (mobility aids) if they are needed. These include: ? Canes. ? Walkers. ? Scooters. ? Crutches.  Turn on the lights when you go into a dark area. Replace any light bulbs as soon as they burn out.  Set up your furniture so you have a clear path. Avoid moving your furniture around.  If any of your floors are uneven, fix them.  If there are any pets around you, be aware of where they are.  Review your medicines with your doctor. Some medicines can make you feel dizzy. This can increase your chance of falling. Ask your doctor what other things that you can do to help prevent falls. This information is not intended to replace advice given to you by your health care provider. Make sure you discuss any questions you have with your health care provider. Document Released: 07/16/2009 Document Revised:  02/25/2016 Document Reviewed: 10/24/2014 Elsevier Interactive Patient Education  2018 [employer].   Health Maintenance, Male A healthy lifestyle and preventive care is important for your health and wellness. Ask your health care provider about what schedule of regular examinations is right for you. What should I know about weight and diet? Eat a Healthy Diet  Eat plenty of vegetables, fruits, whole grains,  low-fat dairy products, and lean protein.  Do not eat a lot of foods high in solid fats, added sugars, or salt.  Maintain a Healthy Weight Regular exercise can help you achieve or maintain a healthy weight. You should:  Do at least 150 minutes of exercise each week. The exercise should increase your heart rate and make you sweat (moderate-intensity exercise).  Do strength-training exercises at least twice a week.  Watch Your Levels of Cholesterol and Blood Lipids  Have your blood tested for lipids and cholesterol every 5 years starting at 68 years of age. If you are at high risk for heart disease, you should start having your blood tested when you are 68 years old. You may need to have your cholesterol levels checked more often if: ? Your lipid or cholesterol levels are high. ? You are older than 68 years of age. ? You are at high risk for heart disease.  What should I know about cancer screening? Many types of cancers can be detected early and may often be prevented. Lung Cancer  You should be screened every year for lung cancer if: ? You are a current smoker who has smoked for at least 30 years. ? You are a former smoker who has quit within the past 15 years.  Talk to your health care provider about your screening options, when you should start screening, and how often you should be screened.  Colorectal Cancer  Routine colorectal cancer screening usually begins at 68 years of age and should be repeated every 5-10 years until you are 68 years old. You may need to be  screened more often if early forms of precancerous polyps or small growths are found. Your health care provider may recommend screening at an earlier age if you have risk factors for colon cancer.  Your health care provider may recommend using home test kits to check for hidden blood in the stool.  A small camera at the end of a tube can be used to examine your colon (sigmoidoscopy or colonoscopy). This checks for the earliest forms of colorectal cancer.  Prostate and Testicular Cancer  Depending on your age and overall health, your health care provider may do certain tests to screen for prostate and testicular cancer.  Talk to your health care provider about any symptoms or concerns you have about testicular or prostate cancer.  Skin Cancer  Check your skin from head to toe regularly.  Tell your health care provider about any new moles or changes in moles, especially if: ? There is a change in a mole's size, shape, or color. ? You have a mole that is larger than a pencil eraser.  Always use sunscreen. Apply sunscreen liberally and repeat throughout the day.  Protect yourself by wearing long sleeves, pants, a wide-brimmed hat, and sunglasses when outside.  What should I know about heart disease, diabetes, and high blood pressure?  If you are 35-47 years of age, have your blood pressure checked every 3-5 years. If you are 6 years of age or older, have your blood pressure checked every year. You should have your blood pressure measured twice-once when you are at a hospital or clinic, and once when you are not at a hospital or clinic. Record the average of the two measurements. To check your blood pressure when you are not at a hospital or clinic, you can use: ? An automated blood pressure machine at a pharmacy. ? A home blood pressure monitor.  Talk to your  health care provider about your target blood pressure.  If you are between 6145-68 years old, ask your health care provider if you  should take aspirin to prevent heart disease.  Have regular diabetes screenings by checking your fasting blood sugar level. ? If you are at a normal weight and have a low risk for diabetes, have this test once every three years after the age of 68. ? If you are overweight and have a high risk for diabetes, consider being tested at a younger age or more often.  A one-time screening for abdominal aortic aneurysm (AAA) by ultrasound is recommended for men aged 65-75 years who are current or former smokers. What should I know about preventing infection? Hepatitis B If you have a higher risk for hepatitis B, you should be screened for this virus. Talk with your health care provider to find out if you are at risk for hepatitis B infection. Hepatitis C Blood testing is recommended for:  Everyone born from [birth year] through 1965.  Anyone with known risk factors for hepatitis C.  Sexually Transmitted Diseases (STDs)  You should be screened each year for STDs including gonorrhea and chlamydia if: ? You are sexually active and are younger than 68 years of age. ? You are older than 68 years of age and your health care provider tells you that you are at risk for this type of infection. ? Your sexual activity has changed since you were last screened and you are at an increased risk for chlamydia or gonorrhea. Ask your health care provider if you are at risk.  Talk with your health care provider about whether you are at high risk of being infected with HIV. Your health care provider may recommend a prescription medicine to help prevent HIV infection.  What else can I do?  Schedule regular health, dental, and eye exams.  Stay current with your vaccines (immunizations).  Do not use any tobacco products, such as cigarettes, chewing tobacco, and e-cigarettes. If you need help quitting, ask your health care provider.  Limit alcohol intake to no more than 2 drinks per day. One drink equals 12 ounces of beer,  5 ounces of wine, or 1 ounces of hard liquor.  Do not use street drugs.  Do not share needles.  Ask your health care provider for help if you need support or information about quitting drugs.  Tell your health care provider if you often feel depressed.  Tell your health care provider if you have ever been abused or do not feel safe at home. This information is not intended to replace advice given to you by your health care provider. Make sure you discuss any questions you have with your health care provider. Document Released: 03/17/2008 Document Revised: 05/18/2016 Document Reviewed: 06/23/2015 Elsevier Interactive Patient Education  2018 [employer]   Hearing Loss Hearing loss is a partial or total loss of the ability to hear. This can be temporary or permanent, and it can happen in one or both ears. Hearing loss may be referred to as deafness. Medical care is necessary to treat hearing loss properly and to prevent the condition from getting worse. Your hearing may partially or completely come back, depending on what caused your hearing loss and how severe it is. In some cases, hearing loss is permanent. What are the causes? Common causes of hearing loss include:  Too much wax in the ear canal.  Infection of the ear canal or middle ear.  Fluid in the middle  ear.  Injury to the ear or surrounding area.  An object stuck in the ear.  Prolonged exposure to loud sounds, such as music.  Less common causes of hearing loss include:  Tumors in the ear.  Viral or bacterial infections, such as meningitis.  A hole in the eardrum (perforated eardrum).  Problems with the hearing nerve that sends signals between the brain and the ear.  Certain medicines.  What are the signs or symptoms? Symptoms of this condition may include:  Difficulty telling the difference between sounds.  Difficulty following a conversation when there is background noise.  Lack of response to sounds  in your environment. This may be most noticeable when you do not respond to startling sounds.  Needing to turn up the volume on the television, radio, etc.  Ringing in the ears.  Dizziness.  Pain in the ears.  How is this diagnosed? This condition is diagnosed based on a physical exam and a hearing test (audiometry). The audiometry test will be performed by a hearing specialist (audiologist). You may also be referred to an ear, nose, and throat (ENT) specialist (otolaryngologist). How is this treated? Treatment for recent onset of hearing loss may include:  Ear wax removal.  Being prescribed medicines to prevent infection (antibiotics).  Being prescribed medicines to reduce inflammation (corticosteroids).  Follow these instructions at home:  If you were prescribed an antibiotic medicine, take it as told by your health care provider. Do not stop taking the antibiotic even if you start to feel better.  Take over-the-counter and prescription medicines only as told by your health care provider.  Avoid loud noises.  Return to your normal activities as told by your health care provider. Ask your health care provider what activities are safe for you.  Keep all follow-up visits as told by your health care provider. This is important. Contact a health care provider if:  You feel dizzy.  You develop new symptoms.  You vomit or feel nauseous.  You have a fever. Get help right away if:  You develop sudden changes in your vision.  You have severe ear pain.  You have new or increased weakness.  You have a severe headache. This information is not intended to replace advice given to you by your health care provider. Make sure you discuss any questions you have with your health care provider. Document Released: 09/19/2005 Document Revised: 02/25/2016 Document Reviewed: 02/04/2015 Elsevier Interactive Patient Education  2018 [employer].

## 2018-06-05 ENCOUNTER — Ambulatory Visit (INDEPENDENT_AMBULATORY_CARE_PROVIDER_SITE_OTHER): Payer: PPO | Admitting: Endocrinology

## 2018-06-05 ENCOUNTER — Encounter: Payer: Self-pay | Admitting: Endocrinology

## 2018-06-05 ENCOUNTER — Other Ambulatory Visit: Payer: Self-pay | Admitting: Adult Health

## 2018-06-05 VITALS — BP 112/60 | HR 82 | Ht 68.0 in | Wt 203.8 lb

## 2018-06-05 DIAGNOSIS — E119 Type 2 diabetes mellitus without complications: Secondary | ICD-10-CM | POA: Diagnosis not present

## 2018-06-05 DIAGNOSIS — Z794 Long term (current) use of insulin: Secondary | ICD-10-CM

## 2018-06-05 LAB — POCT GLYCOSYLATED HEMOGLOBIN (HGB A1C): Hemoglobin A1C: 8.9 % — AB (ref 4.0–5.6)

## 2018-06-05 MED ORDER — FREESTYLE TEST VI STRP: 1.0000 | ORAL_STRIP | Freq: Two times a day (BID) | 3 refills | Status: DC

## 2018-06-05 MED ORDER — INSULIN GLARGINE 100 UNIT/ML SOLOSTAR PEN: 40.0000 [IU] | PEN_INJECTOR | SUBCUTANEOUS | 99 refills | Status: DC

## 2018-06-05 NOTE — Patient Instructions (Addendum)
check your blood sugar twice a day.  vary the time of day when you check, between before the 3 meals, and at bedtime.  also check if you have symptoms of your blood sugar being too high or too low.  please keep a record of the readings and bring it to your next appointment here (or you can bring the meter itself).  You can write it on any piece of paper.  please call us sooner if your blood sugar goes below 70, or if you have a lot of readings over 200. Please increase lantus to 40 units each morning.  Please continue the same other diabetes medications.  Please come back for a follow-up appointment in 2 months.

## 2018-06-05 NOTE — Progress Notes (Signed)
Subjective:    Patient ID: Bernard Roberts, male    DOB: 03/12/1951, 67 y.o.   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   Interval history: pt states he feels well in general.  He takes meds as rx'ed.  He takes lantus 30/d.  He says cbg's are in the mid-100's.   Past Medical History:  Diagnosis Date  . Diabetes mellitus without complication (HCC)   . Elevated PSA    Around 15   . Gout   . Hyperlipidemia   . Hypertension     Past Surgical History:  Procedure Laterality Date  . KNEE ARTHROPLASTY     unknown which side    Social History   Socioeconomic History  . Marital status: Married    Spouse name: Not on file  . Number of children: Not on file  . Years of education: Not on file  . Highest education level: Not on file  Occupational History  . Not on file  Social Needs  . Financial resource strain: Not on file  . Food insecurity:    Worry: Not on file    Inability: Not on file  . Transportation needs:    Medical: Not on file    Non-medical: Not on file  Tobacco Use  . Smoking status: Never Smoker  . Smokeless tobacco: Never Used  . Tobacco comment: smoked over 100 cigerettes per lifetime  Substance and Sexual Activity  . Alcohol use: Yes    Alcohol/week: 0.0 standard drinks    Comment: not a drinker 3 to 4 a week   . Drug use: No  . Sexual activity: Not on file  Lifestyle  . Physical activity:    Days per week: Not on file    Minutes per session: Not on file  . Stress: Not on file  Relationships  . Social connections:    Talks on phone: Not on file    Gets together: Not on file    Attends religious service: Not on file    Active member of  club or organization: Not on file    Attends meetings of clubs or organizations: Not on file    Relationship status: Not on file  . Intimate partner violence:    Fear of current or ex partner: Not on file    Emotionally abused: Not on file    Physically abused: Not on file    Forced sexual activity: Not on file  Other Topics Concern  . Not on file  Social History Narrative   Works for as  [employer]    He likes to swim, write, hike.     Current Outpatient Medications on File Prior to Visit  Medication Sig Dispense Refill  . aspirin EC 81 MG tablet Take 81 mg by mouth daily.    . canagliflozin (INVOKANA) 300 MG TABS tablet Take 1 tablet (300 mg total) by mouth daily before breakfast. 90 tablet 3  . clonazePAM (KLONOPIN) 1 MG tablet Take 0.5 to 1 pill before flight 10 tablet 0  . clonazePAM (KLONOPIN) 1 MG tablet Take 0.5 to 1 pill before flight 10 tablet 0  . finasteride (PROSCAR) 5 MG tablet Take 5 mg by mouth daily.    Marland Kitchen ibuprofen (ADVIL,MOTRIN) 600  MG tablet Take 1 tablet (600 mg total) by mouth every 6 (six) hours as needed. 30 tablet 0  . Lancets (FREESTYLE) lancets USE TO TEST BLOOD GLUCOSE ONCE DAILY 100 each 3  . losartan (COZAAR) 100 MG tablet Take 1 tablet (100 mg total) by mouth daily. 90 tablet 3  . metFORMIN (GLUCOPHAGE-XR) 500 MG 24 hr tablet TAKE 2 TABLETS BY MOUTH EVERY MORNING AND TAKE 3 TABLETS BY MOUTH EVERY EVENING 150 tablet 2  . sildenafil (VIAGRA) 50 MG tablet Take 1-2 tablets as needed 30 tablet 6  . tamsulosin (FLOMAX) 0.4 MG CAPS capsule Take 1 capsule (0.4 mg total) by mouth daily. 90 capsule 3  . testosterone cypionate (DEPOTESTOSTERONE CYPIONATE) 200 MG/ML injection Inject 200 mg into the muscle every 14 (fourteen) days.    . TRULICITY 1.5 MG/0.5ML SOPN INJECT 1.5MG  INTO THE SKIN ONCE A WEEK 1 pen 10  . zolpidem (AMBIEN) 5 MG tablet Take 1 tablet (5 mg total) by mouth at bedtime as needed. 30 tablet 2   No current facility-administered medications on file prior  to visit.     Allergies  Allergen Reactions  . Victoza [Liraglutide] Nausea And Vomiting    Family History  Problem Relation Age of Onset  . Diabetes Father   . Heart disease Father   . Hypertension Father   . Stroke Father   . Diabetes Sister   . Mental illness Sister        Parnoid   . Alcohol abuse Brother   . Hypertension Brother     BP 112/60   Pulse 82   Ht 5\' 8"  (1.727 m)   Wt 203 lb 12.8 oz (92.4 kg)   SpO2 95%   BMI 30.99 kg/m    Review of Systems He denies hypoglycemia.      Objective:   Physical Exam VITAL SIGNS:  See vs page GENERAL: no distress Pulses: foot pulses are intact bilaterally.   MSK: no deformity of the feet or ankles.  CV: trace bilat edema of the legs Skin:  no ulcer on the feet or ankles.  normal color and temp on the feet and ankles Neuro: sensation is intact to touch on the feet and ankles.   Ext: There is bilateral onychomycosis of the toenails.    A1c=8.9%    Assessment & Plan:  Insulin-requiring type 2 DM, with polyneuropathy: he needs increased rx   Patient Instructions  check your blood sugar twice a day.  vary the time of day when you check, between before the 3 meals, and at bedtime.  also check if you have symptoms of your blood sugar being too high or too low.  please keep a record of the readings and bring it to your next appointment here (or you can bring the meter itself).  You can write it on any piece of paper.  please call us sooner if your blood sugar goes below 70, or if you have a lot of readings over 200. Please increase lantus to 40 units each morning.  Please continue the same other diabetes medications.  Please come back for a follow-up appointment in 2 months.

## 2018-06-05 NOTE — Telephone Encounter (Signed)
Bernard Roberts please advise on the refill of the zolpidem 5 mg.    Last ov--09/06/2017 Last refill was 03/06/2018 for #30 with 2 refills.  Thanks

## 2018-06-08 ENCOUNTER — Encounter (HOSPITAL_COMMUNITY): Payer: PPO

## 2018-07-03 ENCOUNTER — Encounter (HOSPITAL_COMMUNITY): Payer: Self-pay | Admitting: Emergency Medicine

## 2018-07-03 ENCOUNTER — Ambulatory Visit (HOSPITAL_COMMUNITY)
Admission: EM | Admit: 2018-07-03 | Discharge: 2018-07-03 | Disposition: A | Discharge status: Home / Self Care | Payer: PPO | Attending: Family Medicine | Admitting: Family Medicine

## 2018-07-03 ENCOUNTER — Other Ambulatory Visit: Payer: Self-pay

## 2018-07-03 DIAGNOSIS — J069 Acute upper respiratory infection, unspecified: Secondary | ICD-10-CM | POA: Diagnosis not present

## 2018-07-03 DIAGNOSIS — B9789 Other viral agents as the cause of diseases classified elsewhere: Secondary | ICD-10-CM

## 2018-07-03 MED ORDER — BENZONATATE 100 MG PO CAPS: 100.0000 mg | ORAL_CAPSULE | Freq: Three times a day (TID) | ORAL | 0 refills | Status: DC

## 2018-07-03 NOTE — ED Triage Notes (Signed)
One week ago started cold symptoms.  Had a cough, then fever intermittently and cough intermittently.  Pain is center, upper chest.  Patient has clear phlegm

## 2018-07-03 NOTE — Discharge Instructions (Addendum)
This is an upper respiratory infection Symptomatic treatment with Tessalon Perles for cough You can try Mucinex over-the-counter for congestion. Follow-up as needed for continued or worsening symptoms

## 2018-07-03 NOTE — ED Provider Notes (Signed)
[facility]    CSN: [id number] Arrival date & time: 07/03/18  1615     History   Chief Complaint Chief Complaint  Patient presents with  . URI    HPI Bernard Roberts is a 67 y.o. male.    URI  Presenting symptoms: congestion and cough   Severity:  Mild Onset quality:  Gradual Duration:  1 week Progression:  Waxing and waning Chronicity:  New Relieved by:  OTC medications Worsened by:  Nothing Associated symptoms: no arthralgias, no headaches, no myalgias, no neck pain, no sinus pain, no sneezing, no swollen glands and no wheezing   Risk factors: diabetes mellitus   Risk factors: no recent illness, no recent travel and no sick contacts     Past Medical History:  Diagnosis Date  . Diabetes mellitus without complication (HCC)   . Elevated PSA    Around 15   . Gout   . Hyperlipidemia   . Hypertension     Patient Active Problem List   Diagnosis Date Noted  . Hyperlipidemia 05/21/2016  . Hypogonadism in male 05/04/2010  . Diabetes type 2, controlled (HCC) 02/23/2010    Past Surgical History:  Procedure Laterality Date  . KNEE ARTHROPLASTY     unknown which side       Home Medications    Prior to Admission medications   Medication Sig Start Date End Date Taking? Authorizing Provider  aspirin EC 81 MG tablet Take 81 mg by mouth daily.    [provider]  benzonatate (TESSALON) 100 MG capsule Take 1 capsule (100 mg total) by mouth every 8 (eight) hours. 07/03/18   Janace Aris, NP  canagliflozin (INVOKANA) 300 MG TABS tablet Take 1 tablet (300 mg total) by mouth daily before breakfast. 09/13/17   Romero Belling, MD  clonazePAM Scarlette [place]ico) 1 MG tablet Take 0.5 to 1 pill before flight 04/06/18   Nelwyn Salisbury, MD  clonazePAM Scarlette [place]) 1 MG tablet Take 0.5 to 1 pill before flight 04/06/18   Nelwyn Salisbury, MD  finasteride (PROSCAR) 5 MG tablet Take 5 mg by mouth daily.    [provider]  FREESTYLE TEST STRIPS test strip 1 each by  Other route 2 (two) times daily. And lancets 2/day 06/05/18   Romero Belling, MD  ibuprofen (ADVIL,MOTRIN) 600 MG tablet Take 1 tablet (600 mg total) by mouth every 6 (six) hours as needed. 03/15/18   Domenick Gong, MD  Insulin Glargine (LANTUS SOLOSTAR) 100 UNIT/ML Solostar Pen Inject 40 Units into the skin every morning. And pen needles 1/day 06/05/18   Romero Belling, MD  Lancets (FREESTYLE) lancets USE TO TEST BLOOD GLUCOSE ONCE DAILY 12/22/17   Nafziger, Kandee Keen, NP  losartan (COZAAR) 100 MG tablet Take 1 tablet (100 mg total) by mouth daily. 10/17/17   Nafziger, Kandee Keen, NP  metFORMIN (GLUCOPHAGE-XR) 500 MG 24 hr tablet TAKE 2 TABLETS BY MOUTH EVERY MORNING AND TAKE 3 TABLETS BY MOUTH EVERY EVENING 04/03/18   Romero Belling, MD  sildenafil (VIAGRA) 50 MG tablet Take 1-2 tablets as needed 06/09/17   Shirline Frees, NP  tamsulosin (FLOMAX) 0.4 MG CAPS capsule Take 1 capsule (0.4 mg total) by mouth daily. 09/06/17   Nafziger, Kandee Keen, NP  testosterone cypionate (DEPOTESTOSTERONE CYPIONATE) 200 MG/ML injection Inject 200 mg into the muscle every 14 (fourteen) days. 06/08/17   [provider]  TRULICITY 1.5 MG/0.5ML SOPN INJECT 1.5MG  INTO THE SKIN ONCE A WEEK 12/05/17   Nafziger, Kandee Keen, NP  zolpidem (AMBIEN) 5  MG tablet TAKE ONE TABLET BY MOUTH EVERY NIGHT AT BEDTIME AS NEEDED 06/05/18   Shirline Frees, NP    Family History Family History  Problem Relation Age of Onset  . Diabetes Father   . Heart disease Father   . Hypertension Father   . Stroke Father   . Diabetes Sister   . Mental illness Sister        Parnoid   . Alcohol abuse Brother   . Hypertension Brother     Social History Social History   Tobacco Use  . Smoking status: Never Smoker  . Smokeless tobacco: Never Used  . Tobacco comment: smoked over 100 cigerettes per lifetime  Substance Use Topics  . Alcohol use: Yes    Alcohol/week: 0.0 standard drinks    Comment: not a drinker 3 to 4 a week   . Drug use: No     Allergies   Victoza  [liraglutide]   Review of Systems Review of Systems  HENT: Positive for congestion. Negative for sinus pain and sneezing.   Respiratory: Positive for cough. Negative for wheezing.   Musculoskeletal: Negative for arthralgias, myalgias and neck pain.  Neurological: Negative for headaches.     Physical Exam Triage Vital Signs ED Triage Vitals  Enc Vitals Group     BP 07/03/18 1638 126/68     Pulse Rate 07/03/18 1638 78     Resp 07/03/18 1638 16     Temp 07/03/18 1638 97.7 F (36.5 C)     Temp Source 07/03/18 1638 Oral     SpO2 07/03/18 1638 97 %     Weight --      Height --      Head Circumference --      Peak Flow --      Pain Score 07/03/18 1701 4     Pain Loc --      Pain Edu? --      Excl. in GC? --    No data found.  Updated Vital Signs BP 126/68 (BP Location: Left Arm)   Pulse 78   Temp 97.7 F (36.5 C) (Oral)   Resp 16   SpO2 97%   Visual Acuity Right Eye Distance:   Left Eye Distance:   Bilateral Distance:    Right Eye Near:   Left Eye Near:    Bilateral Near:     Physical Exam  Constitutional: He is oriented to person, place, and time. He appears well-developed and well-nourished.  Very pleasant. Non toxic or ill appearing.     HENT:  Head: Normocephalic and atraumatic.  Bilateral TMs normal.  External ears normal.  Without posterior oropharyngeal erythema, tonsillar swelling or exudates. No lesions.     Eyes: Conjunctivae are normal.  Neck: Normal range of motion.  Cardiovascular: Normal rate, regular rhythm and normal heart sounds.  Pulmonary/Chest: Effort normal and breath sounds normal.  Lungs clear in all fields. No dyspnea or distress. No retractions or nasal flaring.     Musculoskeletal: Normal range of motion.  Lymphadenopathy:    He has cervical adenopathy.  Neurological: He is alert and oriented to person, place, and time.  Skin: Skin is warm and dry.  Psychiatric: He has a normal mood and affect.  Nursing note and vitals  reviewed.    UC Treatments / Results  Labs (all labs ordered are listed, but only abnormal results are displayed) Labs Reviewed - No data to display  EKG None  Radiology No results found.  Procedures Procedures (  including critical care time)  Medications Ordered in UC Medications - No data to display  Initial Impression / Assessment and Plan / UC Course  I have reviewed the triage vital signs and the nursing notes.  Pertinent labs & imaging results that were available during my care of the patient were reviewed by me and considered in my medical decision making (see chart for details).     Viral URI-symptomatic treatment with benzonatate, Mucinex over-the-counter as needed. Follow up as needed for continued or worsening symptoms  Final Clinical Impressions(s) / UC Diagnoses   Final diagnoses:  Viral URI with cough     Discharge Instructions     This is an upper respiratory infection Symptomatic treatment with Tessalon Perles for cough You can try Mucinex over-the-counter for congestion. Follow-up as needed for continued or worsening symptoms    ED Prescriptions    Medication Sig Dispense Auth. Provider   benzonatate (TESSALON) 100 MG capsule Take 1 capsule (100 mg total) by mouth every 8 (eight) hours. 21 capsule Dahlia Byes A, NP     Controlled Substance Prescriptions [place] Controlled Substance Registry consulted? Not Applicable   Janace Aris, NP 07/03/18 1718

## 2018-07-09 ENCOUNTER — Telehealth: Payer: Self-pay | Admitting: Endocrinology

## 2018-07-09 ENCOUNTER — Other Ambulatory Visit: Payer: Self-pay

## 2018-07-09 MED ORDER — INSULIN GLARGINE 100 UNIT/ML SOLOSTAR PEN: 40.0000 [IU] | PEN_INJECTOR | SUBCUTANEOUS | 99 refills | Status: DC

## 2018-07-09 NOTE — Telephone Encounter (Signed)
Spoke to pharmacy and re-sent rx that was sent in on 06/05/18 reflecting increased dosage of lantus to 40 units

## 2018-07-09 NOTE — Telephone Encounter (Signed)
Pt stated last time he was here Dr.Ellison raised units from 20 to 40 units. please report new dosage to [facility] on [address].

## 2018-07-26 ENCOUNTER — Other Ambulatory Visit: Payer: Self-pay | Admitting: Endocrinology

## 2018-08-01 ENCOUNTER — Other Ambulatory Visit: Payer: Self-pay | Admitting: Adult Health

## 2018-08-01 NOTE — Telephone Encounter (Signed)
Pt is past due for CPX.  Please advise.

## 2018-08-07 ENCOUNTER — Encounter: Payer: Self-pay | Admitting: Endocrinology

## 2018-08-07 ENCOUNTER — Ambulatory Visit (INDEPENDENT_AMBULATORY_CARE_PROVIDER_SITE_OTHER): Payer: PPO | Admitting: Endocrinology

## 2018-08-07 VITALS — BP 126/70 | HR 86 | Ht 68.0 in | Wt 210.2 lb

## 2018-08-07 DIAGNOSIS — Z794 Long term (current) use of insulin: Secondary | ICD-10-CM | POA: Diagnosis not present

## 2018-08-07 DIAGNOSIS — E119 Type 2 diabetes mellitus without complications: Secondary | ICD-10-CM | POA: Diagnosis not present

## 2018-08-07 LAB — POCT GLYCOSYLATED HEMOGLOBIN (HGB A1C): Hemoglobin A1C: 7.5 % — AB (ref 4.0–5.6)

## 2018-08-07 NOTE — Patient Instructions (Addendum)
check your blood sugar twice a day.  vary the time of day when you check, between before the 3 meals, and at bedtime.  also check if you have symptoms of your blood sugar being too high or too low.  please keep a record of the readings and bring it to your next appointment here (or you can bring the meter itself).  You can write it on any piece of paper.  please call us sooner if your blood sugar goes below 70, or if you have a lot of readings over 200. Please continue the same medications, including insulin.   Please come back for a follow-up appointment in 3 months.

## 2018-08-07 NOTE — Progress Notes (Signed)
Subjective:    Patient ID: Bernard Roberts, male    DOB: [birth year], 67 y.o.   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   Interval history: pt states he feels well in general.  He says cbg's are well-controlled.  He seldom has hypoglycemia, and these are mild.   Past Medical History:  Diagnosis Date  . Diabetes mellitus without complication (HCC)   . Elevated PSA    Around 15   . Gout   . Hyperlipidemia   . Hypertension     Past Surgical History:  Procedure Laterality Date  . KNEE ARTHROPLASTY     unknown which side    Social History   Socioeconomic History  . Marital status: Married    Spouse name: Not on file  . Number of children: Not on file  . Years of education: Not on file  . Highest education level: Not on file  Occupational History  . Not on file  Social Needs  . Financial resource strain: Not on file  . Food insecurity:    Worry: Not on file    Inability: Not on file  . Transportation needs:    Medical: Not on file    Non-medical: Not on file  Tobacco Use  . Smoking status: Never Smoker  . Smokeless tobacco: Never Used  . Tobacco comment: smoked over 100 cigerettes per lifetime  Substance and Sexual Activity  . Alcohol use: Yes    Alcohol/week: 0.0 standard drinks    Comment: not a drinker 3 to 4 a week   . Drug use: No  . Sexual activity: Not on file  Lifestyle  . Physical activity:    Days per week: Not on file    Minutes per session: Not on file  . Stress: Not on file  Relationships  . Social connections:    Talks on phone: Not on file    Gets together: Not on file    Attends religious service: Not on file    Active member of  club or organization: Not on file    Attends meetings of clubs or organizations: Not on file    Relationship status: Not on file  . Intimate partner violence:    Fear of current or ex partner: Not on file    Emotionally abused: Not on file    Physically abused: Not on file    Forced sexual activity: Not on file  Other Topics Concern  . Not on file  Social History Narrative   Works for as  [employer]    He likes to swim, write, hike.     Current Outpatient Medications on File Prior to Visit  Medication Sig Dispense Refill  . aspirin EC 81 MG tablet Take 81 mg by mouth daily.    . benzonatate (TESSALON) 100 MG capsule Take 1 capsule (100 mg total) by mouth every 8 (eight) hours. 21 capsule 0  . canagliflozin (INVOKANA) 300 MG TABS tablet Take 1 tablet (300 mg total) by mouth daily before breakfast. 90 tablet 3  . clonazePAM (KLONOPIN) 1 MG tablet Take 0.5 to 1 pill before flight 10 tablet 0  . clonazePAM (KLONOPIN) 1 MG tablet Take 0.5 to 1 pill before flight 10 tablet 0  .  finasteride (PROSCAR) 5 MG tablet Take 5 mg by mouth daily.    Marland Kitchen FREESTYLE TEST STRIPS test strip 1 each by Other route 2 (two) times daily. And lancets 2/day 200 each 3  . ibuprofen (ADVIL,MOTRIN) 600 MG tablet Take 1 tablet (600 mg total) by mouth every 6 (six) hours as needed. 30 tablet 0  . Insulin Glargine (LANTUS SOLOSTAR) 100 UNIT/ML Solostar Pen Inject 40 Units into the skin every morning. And pen needles 1/day 10 pen PRN  . Lancets (FREESTYLE) lancets USE TO TEST BLOOD GLUCOSE ONCE DAILY 100 each 3  . losartan (COZAAR) 100 MG tablet Take 1 tablet (100 mg total) by mouth daily. 90 tablet 3  . metFORMIN (GLUCOPHAGE-XR) 500 MG 24 hr tablet TAKE TWO TABLETS BY MOUTH EVERY MORNING AND TAKE THREE TABLETS BY MOUTH EVERY EVENING 120 tablet 1  . sildenafil (VIAGRA) 50 MG tablet Take 1-2 tablets as needed 30 tablet 6  . tamsulosin (FLOMAX) 0.4 MG CAPS capsule Take 1 capsule (0.4 mg total) by mouth daily. 90 capsule 3  .  testosterone cypionate (DEPOTESTOSTERONE CYPIONATE) 200 MG/ML injection Inject 200 mg into the muscle every 14 (fourteen) days.    . TRULICITY 1.5 MG/0.5ML SOPN INJECT 1.5MG  INTO THE SKIN ONCE A WEEK 1 pen 10  . zolpidem (AMBIEN) 5 MG tablet Take 1 tablet (5 mg total) by mouth at bedtime as needed. No additional refills until seen for physical 30 tablet 0   No current facility-administered medications on file prior to visit.     Allergies  Allergen Reactions  . Victoza [Liraglutide] Nausea And Vomiting    Family History  Problem Relation Age of Onset  . Diabetes Father   . Heart disease Father   . Hypertension Father   . Stroke Father   . Diabetes Sister   . Mental illness Sister        Parnoid   . Alcohol abuse Brother   . Hypertension Brother     BP 126/70 (BP Location: Left Arm, Patient Position: Sitting, Cuff Size: Normal)   Pulse 86   Ht 5\' 8"  (1.727 m)   Wt 210 lb 3.2 oz (95.3 kg)   SpO2 90%   BMI 31.96 kg/m    Review of Systems No LOC    Objective:   Physical Exam VITAL SIGNS:  See vs page GENERAL: no distress Pulses: foot pulses are intact bilaterally.   MSK: no deformity of the feet or ankles.  CV: no edema of the legs or ankles Skin:  no ulcer on the feet or ankles.  normal color and temp on the feet and ankles Neuro: sensation is intact to touch on the feet and ankles, but decreased from normal Ext: There is bilateral onychomycosis of the toenails.   Lab Results  Component Value Date   HGBA1C 7.5 (A) 08/07/2018       Assessment & Plan:  Insulin-requiring type 2 DM, with polyneuropathy: this is the best control this pt should aim for, given this regimen, which does match insulin to his changing needs throughout the day Hypoglycemia: new.  This limits aggressiveness of glycemic control.   Patient Instructions  check your blood sugar twice a day.  vary the time of day when you check, between before the 3 meals, and at bedtime.  also check if you  have symptoms of your blood sugar being too high or too low.  please keep a record of the readings and bring it to your next appointment here (or you  can bring the meter itself).  You can write it on any piece of paper.  please call us sooner if your blood sugar goes below 70, or if you have a lot of readings over 200. Please continue the same medications, including insulin.   Please come back for a follow-up appointment in 3 months.

## 2018-08-28 ENCOUNTER — Encounter: Payer: Self-pay | Admitting: Family Medicine

## 2018-08-28 ENCOUNTER — Other Ambulatory Visit: Payer: Self-pay | Admitting: Adult Health

## 2018-09-13 ENCOUNTER — Encounter: Payer: Self-pay | Admitting: Adult Health

## 2018-09-13 ENCOUNTER — Other Ambulatory Visit: Payer: Self-pay | Admitting: Family Medicine

## 2018-09-13 ENCOUNTER — Ambulatory Visit (INDEPENDENT_AMBULATORY_CARE_PROVIDER_SITE_OTHER): Payer: PPO | Admitting: Adult Health

## 2018-09-13 VITALS — BP 112/70 | Temp 98.5°F | Wt 208.0 lb

## 2018-09-13 DIAGNOSIS — F5101 Primary insomnia: Secondary | ICD-10-CM

## 2018-09-13 DIAGNOSIS — E119 Type 2 diabetes mellitus without complications: Secondary | ICD-10-CM | POA: Diagnosis not present

## 2018-09-13 DIAGNOSIS — Z0000 Encounter for general adult medical examination without abnormal findings: Secondary | ICD-10-CM | POA: Diagnosis not present

## 2018-09-13 DIAGNOSIS — H905 Unspecified sensorineural hearing loss: Secondary | ICD-10-CM

## 2018-09-13 DIAGNOSIS — E782 Mixed hyperlipidemia: Secondary | ICD-10-CM

## 2018-09-13 DIAGNOSIS — E668 Other obesity: Secondary | ICD-10-CM | POA: Diagnosis not present

## 2018-09-13 DIAGNOSIS — R972 Elevated prostate specific antigen [PSA]: Secondary | ICD-10-CM | POA: Diagnosis not present

## 2018-09-13 DIAGNOSIS — N400 Enlarged prostate without lower urinary tract symptoms: Secondary | ICD-10-CM | POA: Diagnosis not present

## 2018-09-13 DIAGNOSIS — I10 Essential (primary) hypertension: Secondary | ICD-10-CM | POA: Diagnosis not present

## 2018-09-13 DIAGNOSIS — M109 Gout, unspecified: Secondary | ICD-10-CM | POA: Insufficient documentation

## 2018-09-13 DIAGNOSIS — Z794 Long term (current) use of insulin: Secondary | ICD-10-CM | POA: Diagnosis not present

## 2018-09-13 LAB — BASIC METABOLIC PANEL
BUN: 32 mg/dL — ABNORMAL HIGH (ref 6–23)
CHLORIDE: 104 meq/L (ref 96–112)
CO2: 25 meq/L (ref 19–32)
Calcium: 9.3 mg/dL (ref 8.4–10.5)
Creatinine, Ser: 1.43 mg/dL (ref 0.40–1.50)
GFR: 52.23 mL/min — AB (ref >60.00)
GLUCOSE: 179 mg/dL — AB (ref 70–99)
POTASSIUM: 4.1 meq/L (ref 3.5–5.1)
SODIUM: 138 meq/L (ref 135–145)

## 2018-09-13 LAB — HEPATIC FUNCTION PANEL
ALK PHOS: 75 U/L (ref 39–117)
ALT: 16 U/L (ref 0–53)
AST: 16 U/L (ref 0–37)
Albumin: 4.2 g/dL (ref 3.5–5.2)
BILIRUBIN TOTAL: 0.5 mg/dL (ref 0.2–1.2)
Bilirubin, Direct: 0.1 mg/dL (ref 0.0–0.3)
TOTAL PROTEIN: 6.6 g/dL (ref 6.0–8.3)

## 2018-09-13 LAB — CBC WITH DIFFERENTIAL/PLATELET
Basophils Absolute: 0.1 10*3/uL (ref 0.0–0.1)
Basophils Relative: 0.7 % (ref 0.0–3.0)
EOS ABS: 0.1 10*3/uL (ref 0.0–0.7)
Eosinophils Relative: 1.5 % (ref 0.0–5.0)
HCT: 49.5 % (ref 39.0–52.0)
HEMOGLOBIN: 16.5 g/dL (ref 13.0–17.0)
LYMPHS ABS: 1.8 10*3/uL (ref 0.7–4.0)
Lymphocytes Relative: 19.9 % (ref 12.0–46.0)
MCHC: 33.4 g/dL (ref 30.0–36.0)
MCV: 85.6 fl (ref 78.0–100.0)
MONOS PCT: 9.8 % (ref 3.0–12.0)
Monocytes Absolute: 0.9 10*3/uL (ref 0.1–1.0)
Neutro Abs: 6.2 10*3/uL (ref 1.4–7.7)
Neutrophils Relative %: 68.1 % (ref 43.0–77.0)
Platelets: 248 10*3/uL (ref 150.0–400.0)
RBC: 5.78 Mil/uL (ref 4.22–5.81)
RDW: 14 % (ref 11.5–15.5)
WBC: 9.1 10*3/uL (ref 4.0–10.5)

## 2018-09-13 LAB — LIPID PANEL
CHOL/HDL RATIO: 6
Cholesterol: 178 mg/dL (ref 0–200)
HDL: 32.2 mg/dL — ABNORMAL LOW (ref >39.00)
Triglycerides: 527 mg/dL — ABNORMAL HIGH (ref 0.0–149.0)

## 2018-09-13 LAB — LDL CHOLESTEROL, DIRECT: Direct LDL: 96 mg/dL

## 2018-09-13 LAB — TSH: TSH: 3.02 u[IU]/mL (ref 0.35–4.50)

## 2018-09-13 MED ORDER — FENOFIBRATE 145 MG PO TABS: 145.0000 mg | ORAL_TABLET | Freq: Every day | ORAL | 3 refills | Status: AC

## 2018-09-13 NOTE — Progress Notes (Signed)
Subjective:    Patient ID: Bernard Roberts, male    DOB: [birth year], 67 y.o.   MRN: [id number]  HPI Patient presents for yearly preventative medicine examination. He is a pleasant 67 year old male who  has a past medical history of Diabetes mellitus without complication (HCC), Elevated PSA, Gout, Hyperlipidemia, and Hypertension.  DM - Currently seen by Endocronology. His regimen includes Invokana 300 mg daily, Lantus 40 units in the morning, metformin 1000 mg extended release in the morning and 1500 mg in the evening,and Trulicity 1.5 mg Lab Results  Component Value Date   HGBA1C 7.5 (A) 08/07/2018   Hypertension -Cozaar 100 mg- stable. Denies dizziness, chest pain, shortness of breath, headaches.  BP Readings from Last 3 Encounters:  09/13/18 112/70  08/07/18 126/70  07/03/18 126/68   ED - uses Viagra PRN   BPH - is followed by Urology. Currently prescribed Flomax and Proscar  Insomnia - takes Ambien PRN   He would like to have referrals to ENT for hearing loss and weight loss clinic for obesity  All immunizations and health maintenance protocols were reviewed with the patient and needed orders were placed. Needs flu shot  Appropriate screening laboratory values were ordered for the patient including screening of hyperlipidemia, renal function and hepatic function. If indicated by BPH, a PSA was ordered.  Medication reconciliation,  past medical history, social history, problem list and allergies were reviewed in detail with the patient  Goals were established with regard to weight loss, exercise, and  diet in compliance with medications. He is going to [employer] 3-4 times a week, doing light weights and swimming for about 30 minutes.   Wt Readings from Last 3 Encounters:  09/13/18 208 lb (94.3 kg)  08/07/18 210 lb 3.2 oz (95.3 kg)  06/05/18 203 lb 12.8 oz (92.4 kg)   End of life planning was discussed.  In our system it shows that he is due for his colonoscopy, he  reports having one three years ago when he is was living in [place],  I do not have these records. He is up to date on routine dental and vision screens    Review of Systems  Constitutional: Negative.   HENT: Positive for hearing loss.   Eyes: Negative.   Respiratory: Negative.   Cardiovascular: Negative.   Gastrointestinal: Negative.   Endocrine: Negative.   Genitourinary: Negative.   Musculoskeletal: Positive for arthralgias.  Skin: Negative.   Allergic/Immunologic: Negative.   Neurological: Negative.   Hematological: Negative.   Psychiatric/Behavioral: Negative.   All other systems reviewed and are negative.  Past Medical History:  Diagnosis Date  . Diabetes mellitus without complication (HCC)   . Elevated PSA    Around 15   . Gout   . Hyperlipidemia   . Hypertension     Social History   Socioeconomic History  . Marital status: Married    Spouse name: Not on file  . Number of children: Not on file  . Years of education: Not on file  . Highest education level: Not on file  Occupational History  . Not on file  Social Needs  . Financial resource strain: Not on file  . Food insecurity:    Worry: Not on file    Inability: Not on file  . Transportation needs:    Medical: Not on file    Non-medical: Not on file  Tobacco Use  . Smoking status: Never Smoker  . Smokeless tobacco: Never Used  . Tobacco  comment: smoked over 100 cigerettes per lifetime  Substance and Sexual Activity  . Alcohol use: Yes    Alcohol/week: 0.0 standard drinks    Comment: not a drinker 3 to 4 a week   . Drug use: No  . Sexual activity: Not on file  Lifestyle  . Physical activity:    Days per week: Not on file    Minutes per session: Not on file  . Stress: Not on file  Relationships  . Social connections:    Talks on phone: Not on file    Gets together: Not on file    Attends religious service: Not on file    Active member of club or organization: Not on file    Attends meetings  of clubs or organizations: Not on file    Relationship status: Not on file  . Intimate partner violence:    Fear of current or ex partner: Not on file    Emotionally abused: Not on file    Physically abused: Not on file    Forced sexual activity: Not on file  Other Topics Concern  . Not on file  Social History Narrative   Works for as  [employer]    He likes to swim, write, hike.     Past Surgical History:  Procedure Laterality Date  . KNEE ARTHROPLASTY     unknown which side    Family History  Problem Relation Age of Onset  . Diabetes Father   . Heart disease Father   . Hypertension Father   . Stroke Father   . Diabetes Sister   . Mental illness Sister        Parnoid   . Alcohol abuse Brother   . Hypertension Brother     Allergies  Allergen Reactions  . Victoza [Liraglutide] Nausea And Vomiting      BP 112/70   Temp 98.5 F (36.9 C)   Wt 208 lb (94.3 kg)   BMI 31.63 kg/m       Objective:   Physical Exam Vitals signs and nursing note reviewed.  Constitutional:      General: He is not in acute distress.    Appearance: He is well-developed. He is obese. He is not ill-appearing or diaphoretic.  HENT:     Head: Normocephalic and atraumatic.     Right Ear: Tympanic membrane, ear canal and external ear normal. There is no impacted cerumen.     Left Ear: Tympanic membrane, ear canal and external ear normal. There is no impacted cerumen.     Nose: Nose normal.     Mouth/Throat:     Mouth: Mucous membranes are moist.     Pharynx: Oropharynx is clear. No oropharyngeal exudate or posterior oropharyngeal erythema.  Eyes:     General:        Right eye: No discharge.        Left eye: No discharge.     Extraocular Movements: Extraocular movements intact.     Conjunctiva/sclera: Conjunctivae normal.     Pupils: Pupils are equal, round, and reactive to light.  Neck:     Thyroid: No thyromegaly.     Trachea: No tracheal deviation.  Cardiovascular:     Rate and  Rhythm: Normal rate and regular rhythm.     Pulses: Normal pulses.     Heart sounds: Normal heart sounds. No murmur. No friction rub. No gallop.   Pulmonary:     Effort: Pulmonary effort is normal. No respiratory distress.  Breath sounds: Normal breath sounds. No stridor. No wheezing, rhonchi or rales.  Chest:     Chest wall: No tenderness.  Abdominal:     General: Bowel sounds are normal. There is no distension.     Palpations: Abdomen is soft. There is no mass.     Tenderness: There is no abdominal tenderness. There is no right CVA tenderness, left CVA tenderness, guarding or rebound.     Hernia: No hernia is present.  Musculoskeletal: Normal range of motion.  Lymphadenopathy:     Cervical: No cervical adenopathy.  Skin:    General: Skin is warm and dry.     Capillary Refill: Capillary refill takes less than 2 seconds.     Coloration: Skin is not jaundiced or pale.     Findings: No bruising, erythema, lesion or rash.  Neurological:     General: No focal deficit present.     Mental Status: He is alert and oriented to person, place, and time. Mental status is at baseline.     Cranial Nerves: No cranial nerve deficit.     Coordination: Coordination normal.  Psychiatric:        Mood and Affect: Mood normal.        Behavior: Behavior normal.        Thought Content: Thought content normal.        Judgment: Judgment normal.       Assessment & Plan:  1. Routine general medical examination at a health care facility - Needs to work on weight loss  - Follow up in one year or sooner if needed  - Basic metabolic panel - CBC with Differential/Platelet - Hepatic function panel - Lipid panel - TSH  2. Controlled type 2 diabetes mellitus without complication, with long-term current use of insulin (HCC) - Follow up with endocrinology as directed  - Basic metabolic panel - CBC with Differential/Platelet - Hepatic function panel - Lipid panel - TSH  3. Mixed hyperlipidemia -  Continue with statin.  - Basic metabolic panel - CBC with Differential/Platelet - Hepatic function panel - Lipid panel - TSH  4. Essential hypertension - Well controlled.  - No change in medications  - Basic metabolic panel - CBC with Differential/Platelet - Hepatic function panel - Lipid panel - TSH  5. Benign prostatic hyperplasia without lower urinary tract symptoms - Follow up with urology as directed   6. Primary insomnia - Continue with ambien   7. Sensorineural hearing loss (SNHL) of right ear, unspecified hearing status on contralateral side  - Ambulatory referral to ENT  8. Other obesity  - Basic metabolic panel - CBC with Differential/Platelet - Hepatic function panel - Lipid panel - TSH - Amb Ref to Medical Weight Management  Shirline Freesory Arilyn Brierley, NP

## 2018-09-13 NOTE — Patient Instructions (Signed)
It was great seeing you today   We will follow up with you regarding your blood work   Someone will call you to schedule your ENT and weight loss clinic visits.   Please let me know if you need anything

## 2018-09-14 ENCOUNTER — Telehealth: Payer: Self-pay | Admitting: Endocrinology

## 2018-09-14 MED ORDER — INSULIN GLARGINE 100 UNIT/ML SOLOSTAR PEN: 60.0000 [IU] | PEN_INJECTOR | Freq: Every day | SUBCUTANEOUS | 99 refills | Status: DC

## 2018-09-14 NOTE — Telephone Encounter (Signed)
Please advise 

## 2018-09-14 NOTE — Telephone Encounter (Signed)
Called pt to inquire further re: insulin usage per day. LVM requesting returned call.

## 2018-09-14 NOTE — Telephone Encounter (Signed)
Pt returned call. States he takes 40 units in the morning and 20 units in the evening for a total of 60 units throughout the course of the day.  Pt also wants to know the status of the diabetic shoe order?

## 2018-09-14 NOTE — Telephone Encounter (Signed)
Called pt and made him aware of criteria. Verbalized acceptance and understanding.

## 2018-09-14 NOTE — Telephone Encounter (Signed)
Patient called requesting an Rx for diabetic shoes.  He also needs a Rx for insulin.  Patient states that he has been using more insulin than normal and when he tried to refill at pharmacy he could it refilled by them. They stated they could not refill until January.  Requesting a call back at [phone]

## 2018-09-14 NOTE — Telephone Encounter (Signed)
Called pt to make him aware. Pt would like additional specifics re: criteria denial for diabetic shoes. Please advise

## 2018-09-14 NOTE — Telephone Encounter (Signed)
Shoes are covered for deformed foot, poor circulation, foot ulcer, or for amputation.

## 2018-09-14 NOTE — Telephone Encounter (Signed)
Ok, I have sent a prescription to your pharmacy.  I don't have dm shoes form, but chart says pt does not qualify.

## 2018-09-14 NOTE — Telephone Encounter (Signed)
Please tell us how many units you take, so we can make sure you get enough

## 2018-09-20 DIAGNOSIS — R972 Elevated prostate specific antigen [PSA]: Secondary | ICD-10-CM | POA: Diagnosis not present

## 2018-09-20 DIAGNOSIS — E291 Testicular hypofunction: Secondary | ICD-10-CM | POA: Diagnosis not present

## 2018-09-20 DIAGNOSIS — N400 Enlarged prostate without lower urinary tract symptoms: Secondary | ICD-10-CM | POA: Diagnosis not present

## 2018-09-25 ENCOUNTER — Other Ambulatory Visit: Payer: Self-pay | Admitting: Adult Health

## 2018-09-27 ENCOUNTER — Ambulatory Visit: Payer: PPO | Admitting: Adult Health

## 2018-09-27 ENCOUNTER — Other Ambulatory Visit: Payer: Self-pay | Admitting: Endocrinology

## 2018-10-09 ENCOUNTER — Other Ambulatory Visit: Payer: Self-pay | Admitting: Otolaryngology

## 2018-10-09 DIAGNOSIS — H6123 Impacted cerumen, bilateral: Secondary | ICD-10-CM | POA: Diagnosis not present

## 2018-10-09 DIAGNOSIS — Z7289 Other problems related to lifestyle: Secondary | ICD-10-CM | POA: Diagnosis not present

## 2018-10-09 DIAGNOSIS — H905 Unspecified sensorineural hearing loss: Secondary | ICD-10-CM

## 2018-10-09 DIAGNOSIS — H9041 Sensorineural hearing loss, unilateral, right ear, with unrestricted hearing on the contralateral side: Secondary | ICD-10-CM | POA: Diagnosis not present

## 2018-10-09 DIAGNOSIS — Z87891 Personal history of nicotine dependence: Secondary | ICD-10-CM | POA: Diagnosis not present

## 2018-10-22 ENCOUNTER — Other Ambulatory Visit: Payer: PPO

## 2018-10-27 ENCOUNTER — Other Ambulatory Visit: Payer: Self-pay | Admitting: Adult Health

## 2018-10-27 ENCOUNTER — Other Ambulatory Visit: Payer: Self-pay | Admitting: Endocrinology

## 2018-10-27 DIAGNOSIS — Z760 Encounter for issue of repeat prescription: Secondary | ICD-10-CM

## 2018-10-30 ENCOUNTER — Encounter (INDEPENDENT_AMBULATORY_CARE_PROVIDER_SITE_OTHER): Payer: PPO

## 2018-11-02 ENCOUNTER — Ambulatory Visit
Admission: RE | Admit: 2018-11-02 | Discharge: 2018-11-02 | Disposition: A | Discharge status: Home / Self Care | Payer: PPO | Source: Ambulatory Visit | Attending: Otolaryngology | Admitting: Otolaryngology

## 2018-11-02 DIAGNOSIS — H905 Unspecified sensorineural hearing loss: Secondary | ICD-10-CM

## 2018-11-07 ENCOUNTER — Ambulatory Visit (INDEPENDENT_AMBULATORY_CARE_PROVIDER_SITE_OTHER): Payer: PPO | Admitting: Bariatrics

## 2018-11-07 ENCOUNTER — Encounter (INDEPENDENT_AMBULATORY_CARE_PROVIDER_SITE_OTHER): Payer: Self-pay | Admitting: Bariatrics

## 2018-11-07 VITALS — BP 123/74 | HR 80 | Temp 97.9°F | Ht 68.0 in | Wt 201.0 lb

## 2018-11-07 DIAGNOSIS — Z1331 Encounter for screening for depression: Secondary | ICD-10-CM

## 2018-11-07 DIAGNOSIS — E559 Vitamin D deficiency, unspecified: Secondary | ICD-10-CM

## 2018-11-07 DIAGNOSIS — E669 Obesity, unspecified: Secondary | ICD-10-CM

## 2018-11-07 DIAGNOSIS — Z0289 Encounter for other administrative examinations: Secondary | ICD-10-CM

## 2018-11-07 DIAGNOSIS — M109 Gout, unspecified: Secondary | ICD-10-CM

## 2018-11-07 DIAGNOSIS — Z794 Long term (current) use of insulin: Secondary | ICD-10-CM | POA: Diagnosis not present

## 2018-11-07 DIAGNOSIS — E7849 Other hyperlipidemia: Secondary | ICD-10-CM | POA: Diagnosis not present

## 2018-11-07 DIAGNOSIS — E119 Type 2 diabetes mellitus without complications: Secondary | ICD-10-CM

## 2018-11-07 DIAGNOSIS — R5383 Other fatigue: Secondary | ICD-10-CM | POA: Diagnosis not present

## 2018-11-07 DIAGNOSIS — R0602 Shortness of breath: Secondary | ICD-10-CM | POA: Diagnosis not present

## 2018-11-07 DIAGNOSIS — Z6830 Body mass index (BMI) 30.0-30.9, adult: Secondary | ICD-10-CM | POA: Diagnosis not present

## 2018-11-08 ENCOUNTER — Encounter (INDEPENDENT_AMBULATORY_CARE_PROVIDER_SITE_OTHER): Payer: Self-pay | Admitting: Bariatrics

## 2018-11-08 DIAGNOSIS — Z6830 Body mass index (BMI) 30.0-30.9, adult: Secondary | ICD-10-CM

## 2018-11-08 DIAGNOSIS — E669 Obesity, unspecified: Secondary | ICD-10-CM | POA: Insufficient documentation

## 2018-11-08 LAB — VITAMIN D 25 HYDROXY (VIT D DEFICIENCY, FRACTURES): Vit D, 25-Hydroxy: 6.3 ng/mL — ABNORMAL LOW (ref 30.0–100.0)

## 2018-11-08 NOTE — Progress Notes (Signed)
Office: [phone]  /  Fax: [phone]   Dear Bernard Peng, NP,   Thank you for referring Bernard Roberts to our clinic. The following note includes my evaluation and treatment recommendations.  HPI:   Chief Complaint: OBESITY    Bernard Roberts has been referred by Bernard Peng, NP for consultation regarding his obesity and obesity related comorbidities.    Bernard Roberts (MR# [id number]) is a 68 y.o. male who presents on 11/07/2018 for obesity evaluation and treatment. Current BMI is Body mass index is 30.56 kg/m.Marland Kitchen Bernard Roberts has been struggling with his weight for many years and has been unsuccessful in either losing weight, maintaining weight loss, or reaching his healthy weight goal.     Bernard Roberts attended our information session and states he is currently in the action stage of change and ready to dedicate time achieving and maintaining a healthier weight. Bernard Roberts is interested in becoming our patient and working on intensive lifestyle modifications including (but not limited to) diet, exercise and weight loss.    Bernard Roberts states his family eats meals together he thinks his family will eat healthier with  him he has been heavy most of  his life he started gaining weight at 7 yrs of age his heaviest weight ever was 250 lbs. he has significant food cravings issues  he does snack at night (whatever is around) he skips meals frequently he is frequently drinking liquids with calories he frequently makes poor food choices he frequently eats larger portions than normal  he struggles with emotional eating    Fatigue Bernard Roberts feels his energy is lower than it should be. This has worsened with weight gain and has not worsened recently. Bernard Roberts admits to daytime somnolence and he admits to waking up still tired. Patient is at risk for obstructive sleep apnea. Patent has a history of symptoms of daytime fatigue and morning fatigue. Patient generally gets 6 or 7 hours of sleep per night, and states  they generally have restless sleep. Snoring is not present. Apneic episodes are not present. Epworth Sleepiness Score is 10  Dyspnea on exertion Bernard Roberts notes increasing shortness of breath with certain activities (climbing stairs) and seems to be worsening over time with weight gain. He notes getting out of breath sooner with activity than he used to. This has not gotten worse recently. Bernard Roberts denies orthopnea.  Diabetes II Bernard Roberts has a diagnosis of diabetes type II. He is on Lantus 60 units. Bernard Roberts denies any hypoglycemic episodes. Last A1c was at 7.5 on 08/07/18. He has been working on intensive lifestyle modifications including diet, exercise, and weight loss to help control his blood glucose levels.  Hyperlipidemia/Elevated Triglycerides Bernard Roberts has hyperlipidemia and he is taking Tricor. Bernard Roberts is attempting to improve his cholesterol levels with intensive lifestyle modification including a low saturated fat diet, exercise and weight loss. He denies myalgias.  Gout Bernard Roberts has very periodic gout and he is periodically taking Colchine.  Vitamin D deficiency Bernard Roberts has a diagnosis of vitamin D deficiency. He is not currently taking vit D and denies nausea, vomiting or muscle weakness.  Depression Screen Bernard Roberts Food and Mood (modified PHQ-9) score was  Depression screen PHQ 2/9 11/07/2018  Decreased Interest 1  Down, Depressed, Hopeless 1  PHQ - 2 Score 2  Altered sleeping 1  Tired, decreased energy 2  Change in appetite 1  Feeling bad or failure about yourself  1  Trouble concentrating 0  Moving slowly or fidgety/restless 0  Suicidal thoughts 0  PHQ-9 Score  7  Difficult doing work/chores Not difficult at all    ASSESSMENT AND PLAN:  Other fatigue - Plan: EKG 12-Lead  Shortness of breath on exertion  Other hyperlipidemia  Gout, unspecified cause, unspecified chronicity, unspecified site  Vitamin D deficiency - Plan: VITAMIN D 25 Hydroxy (Vit-D Deficiency,  Fractures)  Type 2 diabetes mellitus without complication, with long-term current use of insulin (HCC)  Depression screening  Class 1 obesity with serious comorbidity and body mass index (BMI) of 30.0 to 30.9 in adult, unspecified obesity type  PLAN:  Fatigue Bernard Roberts was informed that his fatigue may be related to obesity, depression or many other causes. Labs will be ordered, and in the meanwhile Bernard Roberts has agreed to work on diet, exercise and weight loss to help with fatigue. Proper sleep hygiene was discussed including the need for 7-8 hours of quality sleep each night. A sleep study was not ordered based on symptoms and Epworth score.  Dyspnea on exertion Bernard Roberts's shortness of breath appears to be obesity related and exercise induced. He has agreed to work on weight loss and gradually increase exercise to treat his exercise induced shortness of breath. If Bernard Roberts follows our instructions and loses weight without improvement of his shortness of breath, we will plan to refer to pulmonology. We will monitor this condition regularly. Bernard Roberts agrees to this plan.  Diabetes II Bernard Roberts has been given extensive diabetes education by myself today including ideal fasting and post-prandial blood glucose readings, individual ideal Hgb A1c goals and hypoglycemia prevention. We discussed the importance of good blood sugar control to decrease the likelihood of diabetic complications such as nephropathy, neuropathy, limb loss, blindness, coronary artery disease, and death. We discussed the importance of intensive lifestyle modification including diet, exercise and weight loss as the first line treatment for diabetes. Bernard Roberts agrees to continue his diabetes medications and will follow up at the agreed upon time.  Hyperlipidemia/Elevated Triglycerides Bernard Roberts was informed of the American Heart Association Guidelines emphasizing intensive lifestyle modifications as the first line treatment for hyperlipidemia. We  discussed many lifestyle modifications today in depth, and Bernard Roberts will continue to work on decreasing saturated fats such as fatty red meat, butter and many fried foods. He will also increase vegetables and lean protein in his diet and continue to work on exercise and weight loss efforts. Bernard Roberts will continue his medications as prescribed.  Gout Bernard Roberts will follow up with his PCP. He agrees to follow up with our clinic in 2 weeks.  Vitamin D Deficiency Bernard Roberts was informed that low vitamin D levels contributes to fatigue and are associated with obesity, breast, and colon cancer. We will check vitamin D level and he will follow up for routine testing of vitamin D, at least 2-3 times per year.   Depression Screen Bernard Roberts had a mildly positive depression screening. Depression is commonly associated with obesity and often results in emotional eating behaviors. We will monitor this closely and work on CBT to help improve the non-hunger eating patterns. Referral to Psychology may be required if no improvement is seen as he continues in our clinic.  Obesity Bernard Roberts is currently in the action stage of change and his goal is to continue with weight loss efforts. I recommend Bernard Roberts begin the structured treatment plan as follows:  He has agreed to follow the Category 3 plan with 200 to 350 calorie microwave meal Bernard Roberts has been instructed to eventually work up to a goal of 150 minutes of combined cardio and strengthening exercise per week for  weight loss and overall health benefits. We discussed the following Behavioral Modification Strategies today: increase H2O intake, keeping healthy foods in the home, increasing lean protein intake, decreasing simple carbohydrates, increasing vegetables and work on meal planning and intentional eating   He was informed of the importance of frequent follow up visits to maximize his success with intensive lifestyle modifications for his multiple health conditions. He was  informed we would discuss his lab results at his next visit unless there is a critical issue that needs to be addressed sooner. Bernard Roberts agreed to keep his next visit at the agreed upon time to discuss these results.  ALLERGIES: Allergies  Allergen Reactions  . Victoza [Liraglutide] Nausea And Vomiting    MEDICATIONS: Current Outpatient Medications on File Prior to Visit  Medication Sig Dispense Refill  . aspirin EC 81 MG tablet Take 81 mg by mouth daily.    . fenofibrate (TRICOR) 145 MG tablet Take 1 tablet (145 mg total) by mouth daily. 90 tablet 3  . finasteride (PROSCAR) 5 MG tablet Take 5 mg by mouth daily.    . Insulin Glargine (LANTUS SOLOSTAR) 100 UNIT/ML Solostar Pen Inject 60 Units into the skin daily. And pen needles 1/day 15 pen PRN  . INVOKANA 300 MG TABS tablet TAKE ONE TABLET BY MOUTH EVERY MORNING BEFORE BREAKFAST 30 tablet 1  . Lancets (FREESTYLE) lancets USE TO TEST BLOOD GLUCOSE ONCE DAILY 100 each 3  . losartan (COZAAR) 100 MG tablet Take 1 tablet (100 mg total) by mouth daily. 90 tablet 3  . metFORMIN (GLUCOPHAGE-XR) 500 MG 24 hr tablet TAKE TWO TABLETS BY MOUTH EVERY MORNING AND TAKE THREE TABLETS BY MOUTH EVERY EVENING 120 tablet 0  . tamsulosin (FLOMAX) 0.4 MG CAPS capsule TAKE ONE CAPSULE BY MOUTH DAILY 90 capsule 1  . TRULICITY 1.5 EF/0.0FH SOPN INJECT 1.5MG INTO THE SKIN ONCE A WEEK 1 pen 10  . clonazePAM (KLONOPIN) 1 MG tablet Take 0.5 to 1 pill before flight 10 tablet 0  . clonazePAM (KLONOPIN) 1 MG tablet Take 0.5 to 1 pill before flight 10 tablet 0  . FREESTYLE TEST STRIPS test strip 1 each by Other route 2 (two) times daily. And lancets 2/day 200 each 3  . ibuprofen (ADVIL,MOTRIN) 600 MG tablet Take 1 tablet (600 mg total) by mouth every 6 (six) hours as needed. (Patient not taking: Reported on 11/07/2018) 30 tablet 0  . sildenafil (VIAGRA) 50 MG tablet Take 1-2 tablets as needed 30 tablet 6  . testosterone cypionate (DEPOTESTOSTERONE CYPIONATE) 200 MG/ML  injection Inject 200 mg into the muscle every 14 (fourteen) days.    Marland Kitchen zolpidem (AMBIEN) 5 MG tablet Take 1 tablet (5 mg total) by mouth at bedtime. (Patient not taking: Reported on 11/07/2018) 30 tablet 2   No current facility-administered medications on file prior to visit.     PAST MEDICAL HISTORY: Past Medical History:  Diagnosis Date  . Anxiety   . Arthritis of right knee   . Back pain   . Depression   . Diabetes mellitus without complication ([place])   . Elevated PSA    Around 15   . Enlarged prostate   . Gout   . Hyperlipidemia   . Hypertension   . Joint pain   . Shortness of breath     PAST SURGICAL HISTORY: Past Surgical History:  Procedure Laterality Date  . KNEE ARTHROPLASTY     unknown which side    SOCIAL HISTORY: Social History   Tobacco Use  .  Smoking status: Never Smoker  . Smokeless tobacco: Never Used  . Tobacco comment: smoked over 100 cigerettes per lifetime  Substance Use Topics  . Alcohol use: Yes    Alcohol/week: 0.0 standard drinks    Comment: not a drinker 3 to 4 a week   . Drug use: No    FAMILY HISTORY: Family History  Problem Relation Age of Onset  . Depression Mother   . Anxiety disorder Mother   . Obesity Mother   . Diabetes Father   . Heart disease Father   . Hypertension Father   . Stroke Father   . High Cholesterol Father   . Depression Father   . Obesity Father   . Diabetes Sister   . Mental illness Sister        Bernard Roberts   . Alcohol abuse Brother   . Hypertension Brother     ROS: Review of Systems  Constitutional: Positive for malaise/fatigue.  HENT: Positive for hearing loss and tinnitus.   Eyes:       + Wear Glasses or Contacts  Respiratory: Positive for shortness of breath (on exertion).   Cardiovascular: Negative for orthopnea.       + Leg Cramping  Gastrointestinal: Positive for heartburn. Negative for nausea and vomiting.  Genitourinary: Positive for frequency.  Musculoskeletal: Negative for myalgias.        Negative for muscle weakness  Endo/Heme/Allergies: Positive for polydipsia.       Negative for hypoglycemia  Psychiatric/Behavioral: The patient has insomnia.     PHYSICAL EXAM: Blood pressure 123/74, pulse 80, temperature 97.9 F (36.6 C), temperature source Oral, height '5\' 8"'  (1.727 m), weight 201 lb (91.2 kg), SpO2 96 %. Body mass index is 30.56 kg/m. Physical Exam Vitals signs reviewed.  Constitutional:      Appearance: He is well-developed.  HENT:     Head: Normocephalic and atraumatic.     Nose: Nose normal.     Mouth/Throat:     Comments: Mallampati= 3 Neck:     Musculoskeletal: Normal range of motion and neck supple.     Thyroid: No thyromegaly.  Cardiovascular:     Rate and Rhythm: Normal rate and regular rhythm.  Pulmonary:     Effort: Pulmonary effort is normal. No respiratory distress.  Abdominal:     Palpations: Abdomen is soft.     Tenderness: There is no abdominal tenderness.  Musculoskeletal: Normal range of motion.     Comments: Range of Motion normal in all 4 extremities  Skin:    General: Skin is warm and dry.  Neurological:     Mental Status: He is alert and oriented to person, place, and time.  Psychiatric:        Behavior: Behavior normal.     RECENT LABS AND TESTS: BMET    Component Value Date/Time   NA 138 09/13/2018 0821   K 4.1 09/13/2018 0821   CL 104 09/13/2018 0821   CO2 25 09/13/2018 0821   GLUCOSE 179 (H) 09/13/2018 0821   BUN 32 (H) 09/13/2018 0821   CREATININE 1.43 09/13/2018 0821   CREATININE 1.18 09/28/2014 1221   CALCIUM 9.3 09/13/2018 0821   GFRNONAA 86.03 02/23/2010 1600   GFRAA  10/04/2008 2130    >60        The eGFR has been calculated using the MDRD equation. This calculation has not been validated in all clinical situations. eGFR's persistently <60 mL/min signify possible Chronic Kidney Disease.   Lab Results  Component Value Date  HGBA1C 7.5 (A) 08/07/2018   No results found for: INSULIN CBC     Component Value Date/Time   WBC 9.1 09/13/2018 0821   RBC 5.78 09/13/2018 0821   HGB 16.5 09/13/2018 0821   HCT 49.5 09/13/2018 0821   PLT 248.0 09/13/2018 0821   MCV 85.6 09/13/2018 0821   MCV 83.0 09/28/2014 1225   MCH 27.2 09/28/2014 1225   MCHC 33.4 09/13/2018 0821   RDW 14.0 09/13/2018 0821   LYMPHSABS 1.8 09/13/2018 0821   MONOABS 0.9 09/13/2018 0821   EOSABS 0.1 09/13/2018 0821   BASOSABS 0.1 09/13/2018 0821   Iron/TIBC/Ferritin/ %Sat No results found for: IRON, TIBC, FERRITIN, IRONPCTSAT Lipid Panel     Component Value Date/Time   CHOL 178 09/13/2018 0821   TRIG (H) 09/13/2018 0821    527.0 Triglyceride is over 400; calculations on Lipids are invalid.   HDL 32.20 (L) 09/13/2018 0821   CHOLHDL 6 09/13/2018 0821   VLDL 44.0 (H) 05/30/2016 0929   LDLDIRECT 96.0 09/13/2018 0821   Hepatic Function Panel     Component Value Date/Time   PROT 6.6 09/13/2018 0821   ALBUMIN 4.2 09/13/2018 0821   AST 16 09/13/2018 0821   ALT 16 09/13/2018 0821   ALKPHOS 75 09/13/2018 0821   BILITOT 0.5 09/13/2018 0821   BILIDIR 0.1 09/13/2018 0821      Component Value Date/Time   TSH 3.02 09/13/2018 0821   TSH 2.78 06/09/2017 1040   TSH 2.40 05/30/2016 0929    ECG  shows NSR with a rate of 80 BPM INDIRECT CALORIMETER done today shows a VO2 of 300 and a REE of 2090.  His calculated basal metabolic rate is 1478 thus his basal metabolic rate is better than expected.       OBESITY BEHAVIORAL INTERVENTION VISIT  Today's visit was # 1   Starting weight: 201 lbs Starting date: 11/07/2018 Today's weight : 201 lbs  Today's date: 11/07/2018 Total lbs lost to date: 0 At least 15 minutes were spent on discussing the following behavioral intervention visit.   ASK: We discussed the diagnosis of obesity with Bernard Roberts today and Bernard Roberts agreed to give [place] permission to discuss obesity behavioral modification therapy today.  ASSESS: Kahlel has the diagnosis of obesity and his BMI  today is 30.57 Jerimie is in the action stage of change   ADVISE: Mckinley was educated on the multiple health risks of obesity as well as the benefit of weight loss to improve his health. He was advised of the need for long term treatment and the importance of lifestyle modifications to improve his current health and to decrease his risk of future health problems.  AGREE: Multiple dietary modification options and treatment options were discussed and  Reyli agreed to follow the recommendations documented in the above note.  ARRANGE: Bronsen was educated on the importance of frequent visits to treat obesity as outlined per CMS and USPSTF guidelines and agreed to schedule his next follow up appointment today.  Corey Skains, am acting as Location manager for [employer]. Owens Shark, DO  I have reviewed the above documentation for accuracy and completeness, and I agree with the above. -Jearld Lesch, DO

## 2018-11-11 ENCOUNTER — Encounter (INDEPENDENT_AMBULATORY_CARE_PROVIDER_SITE_OTHER): Payer: Self-pay | Admitting: Bariatrics

## 2018-11-13 ENCOUNTER — Ambulatory Visit (INDEPENDENT_AMBULATORY_CARE_PROVIDER_SITE_OTHER): Payer: PPO | Admitting: Endocrinology

## 2018-11-13 ENCOUNTER — Encounter: Payer: Self-pay | Admitting: Endocrinology

## 2018-11-13 VITALS — BP 132/78 | HR 90 | Wt 206.4 lb

## 2018-11-13 DIAGNOSIS — E119 Type 2 diabetes mellitus without complications: Secondary | ICD-10-CM | POA: Diagnosis not present

## 2018-11-13 DIAGNOSIS — Z794 Long term (current) use of insulin: Secondary | ICD-10-CM | POA: Diagnosis not present

## 2018-11-13 LAB — POCT GLYCOSYLATED HEMOGLOBIN (HGB A1C): Hemoglobin A1C: 8.8 % — AB (ref 4.0–5.6)

## 2018-11-13 NOTE — Patient Instructions (Signed)
check your blood sugar twice a day.  vary the time of day when you check, between before the 3 meals, and at bedtime.  also check if you have symptoms of your blood sugar being too high or too low.  please keep a record of the readings and bring it to your next appointment here (or you can bring the meter itself).  You can write it on any piece of paper.  please call us sooner if your blood sugar goes below 70, or if you have a lot of readings over 200. Please continue the same medications, including insulin.   Please come back for a follow-up appointment in 2 months.

## 2018-11-13 NOTE — Progress Notes (Signed)
Subjective:    Patient ID: Bernard Roberts, male    DOB: 1950/12/08, 68 y.o.   MRN: [id number]  HPI Pt returns for f/u of diabetes mellitus: DM type: Insulin-requiring type 2 Dx'ed: 1998 Complications: polyneuropathy Therapy: insulin, trulicity, and 2 oral meds DKA: never Severe hypoglycemia: never Pancreatitis: never Pancreatic imaging: never Other: he also took insulin approx 2014-2017; he declines multiple daily injections.   Interval history: pt states he feels well in general.  He says cbg's vary from 120-250.  He seldom has hypoglycemia, and these are mild.  He has started weight loss program.  Past Medical History:  Diagnosis Date  . Anxiety   . Arthritis of right knee   . Back pain   . Depression   . Diabetes mellitus without complication (HCC)   . Elevated PSA    Around 15   . Enlarged prostate   . Gout   . Hyperlipidemia   . Hypertension   . Joint pain   . Shortness of breath     Past Surgical History:  Procedure Laterality Date  . KNEE ARTHROPLASTY     unknown which side    Social History   Socioeconomic History  . Marital status: Married    Spouse name: Cristino Martes  . Number of children: Not on file  . Years of education: Not on file  . Highest education level: Not on file  Occupational History  . Occupation: [employer], Clinical research associate  Social Needs  . Financial resource strain: Not on file  . Food insecurity:    Worry: Not on file    Inability: Not on file  . Transportation needs:    Medical: Not on file    Non-medical: Not on file  Tobacco Use  . Smoking status: Never Smoker  . Smokeless tobacco: Never Used  . Tobacco comment: smoked over 100 cigerettes per lifetime  Substance and Sexual Activity  . Alcohol use: Yes    Alcohol/week: 0.0 standard drinks    Comment: not a drinker 3 to 4 a week   . Drug use: No  . Sexual activity: Not on file  Lifestyle  . Physical activity:    Days per week: Not on file    Minutes per session: Not on file  .  Stress: Not on file  Relationships  . Social connections:    Talks on phone: Not on file    Gets together: Not on file    Attends religious service: Not on file    Active member of club or organization: Not on file    Attends meetings of clubs or organizations: Not on file    Relationship status: Not on file  . Intimate partner violence:    Fear of current or ex partner: Not on file    Emotionally abused: Not on file    Physically abused: Not on file    Forced sexual activity: Not on file  Other Topics Concern  . Not on file  Social History Narrative   Works for as  [employer]    He likes to swim, write, hike.     Current Outpatient Medications on File Prior to Visit  Medication Sig Dispense Refill  . aspirin EC 81 MG tablet Take 81 mg by mouth daily.    . fenofibrate (TRICOR) 145 MG tablet Take 1 tablet (145 mg total) by mouth daily. 90 tablet 3  . finasteride (PROSCAR) 5 MG tablet Take 5 mg by mouth daily.    Marland Kitchen FREESTYLE TEST  STRIPS test strip 1 each by Other route 2 (two) times daily. And lancets 2/day 200 each 3  . ibuprofen (ADVIL,MOTRIN) 600 MG tablet Take 1 tablet (600 mg total) by mouth every 6 (six) hours as needed. 30 tablet 0  . Insulin Glargine (LANTUS SOLOSTAR) 100 UNIT/ML Solostar Pen Inject 60 Units into the skin daily. And pen needles 1/day 15 pen PRN  . INVOKANA 300 MG TABS tablet TAKE ONE TABLET BY MOUTH EVERY MORNING BEFORE BREAKFAST 30 tablet 1  . Lancets (FREESTYLE) lancets USE TO TEST BLOOD GLUCOSE ONCE DAILY 100 each 3  . losartan (COZAAR) 100 MG tablet Take 1 tablet (100 mg total) by mouth daily. 90 tablet 3  . metFORMIN (GLUCOPHAGE-XR) 500 MG 24 hr tablet TAKE TWO TABLETS BY MOUTH EVERY MORNING AND TAKE THREE TABLETS BY MOUTH EVERY EVENING (Patient taking differently: TAKE TWO TABLETS BY MOUTH EVERY MORNING AND TAKE TWO TABLETS BY MOUTH EVERY EVENING) 120 tablet 0  . sildenafil (VIAGRA) 50 MG tablet Take 1-2 tablets as needed 30 tablet 6  . tamsulosin (FLOMAX) 0.4  MG CAPS capsule TAKE ONE CAPSULE BY MOUTH DAILY 90 capsule 1  . testosterone cypionate (DEPOTESTOSTERONE CYPIONATE) 200 MG/ML injection Inject 200 mg into the muscle every 14 (fourteen) days.    . TRULICITY 1.5 MG/0.5ML SOPN INJECT 1.5MG  INTO THE SKIN ONCE A WEEK 1 pen 10  . zolpidem (AMBIEN) 5 MG tablet Take 1 tablet (5 mg total) by mouth at bedtime. 30 tablet 2   No current facility-administered medications on file prior to visit.     Allergies  Allergen Reactions  . Victoza [Liraglutide] Nausea And Vomiting    Family History  Problem Relation Age of Onset  . Depression Mother   . Anxiety disorder Mother   . Obesity Mother   . Diabetes Father   . Heart disease Father   . Hypertension Father   . Stroke Father   . High Cholesterol Father   . Depression Father   . Obesity Father   . Diabetes Sister   . Mental illness Sister        Parnoid   . Alcohol abuse Brother   . Hypertension Brother     BP 132/78 (BP Location: Right Arm, Patient Position: Sitting, Cuff Size: Normal)   Pulse 90   Wt 206 lb 6.4 oz (93.6 kg)   SpO2 94%   BMI 31.38 kg/m    Review of Systems He denies hypoglycemia.  He has lost a few lbs, due to his efforts.      Objective:   Physical Exam VITAL SIGNS:  See vs page GENERAL: no distress Pulses: foot pulses are intact bilaterally.   MSK: no deformity of the feet or ankles.  CV: no edema of the legs or ankles Skin:  no ulcer on the feet or ankles.  normal color and temp on the feet and ankles Neuro: sensation is intact to touch on the feet and ankles, but decreased from normal Ext: There is bilateral onychomycosis of the toenails.   Lab Results  Component Value Date   HGBA1C 8.8 (A) 11/13/2018      Assessment & Plan:  Insulin-requiring type 2 DM, with PN: worse.  Weight loss, intentional: he wants to work on continuing this, rather than increasing insulin.    Patient Instructions  check your blood sugar twice a day.  vary the time of day  when you check, between before the 3 meals, and at bedtime.  also check if you have  symptoms of your blood sugar being too high or too low.  please keep a record of the readings and bring it to your next appointment here (or you can bring the meter itself).  You can write it on any piece of paper.  please call us sooner if your blood sugar goes below 70, or if you have a lot of readings over 200. Please continue the same medications, including insulin.   Please come back for a follow-up appointment in 2 months.

## 2018-11-21 ENCOUNTER — Ambulatory Visit (INDEPENDENT_AMBULATORY_CARE_PROVIDER_SITE_OTHER): Payer: PPO | Admitting: Bariatrics

## 2018-11-21 ENCOUNTER — Other Ambulatory Visit: Payer: Self-pay | Admitting: Adult Health

## 2018-11-21 VITALS — BP 123/67 | HR 87 | Temp 98.0°F | Ht 68.0 in | Wt 199.0 lb

## 2018-11-21 DIAGNOSIS — E781 Pure hyperglyceridemia: Secondary | ICD-10-CM | POA: Diagnosis not present

## 2018-11-21 DIAGNOSIS — E559 Vitamin D deficiency, unspecified: Secondary | ICD-10-CM

## 2018-11-21 DIAGNOSIS — E119 Type 2 diabetes mellitus without complications: Secondary | ICD-10-CM | POA: Diagnosis not present

## 2018-11-21 DIAGNOSIS — E669 Obesity, unspecified: Secondary | ICD-10-CM | POA: Diagnosis not present

## 2018-11-21 DIAGNOSIS — Z6833 Body mass index (BMI) 33.0-33.9, adult: Secondary | ICD-10-CM

## 2018-11-21 DIAGNOSIS — N183 Chronic kidney disease, stage 3 unspecified: Secondary | ICD-10-CM

## 2018-11-21 DIAGNOSIS — Z794 Long term (current) use of insulin: Secondary | ICD-10-CM

## 2018-11-21 MED ORDER — VITAMIN D (ERGOCALCIFEROL) 1.25 MG (50000 UNIT) PO CAPS: 50000.0000 [IU] | ORAL_CAPSULE | ORAL | 0 refills | Status: DC

## 2018-11-22 NOTE — Telephone Encounter (Signed)
Denied.  Message sent to the pharmacy stating this needs to be sent to Dr. Romero Belling at [facility].

## 2018-11-22 NOTE — Progress Notes (Signed)
Office: [phone]  /  Fax: [phone]   HPI:   Chief Complaint: OBESITY Bernard Roberts is here to discuss his progress with his obesity treatment plan. He is on the Category 3 plan with 200 to 350 calorie microwave meal and is following his eating plan approximately 45 % of the time. He states he is swimming 30 minutes 4 times per week. Bernard Roberts is doing well overall. "He did not have a good two weeks". He travels and is going out to dinner which can be difficult. He has not been hungry. Bernard Roberts has had non-specific cravings. His weight is 199 lb (90.3 kg) today and has had a weight loss of 2 pounds over a period of 2 weeks since his last visit. He has lost 2 lbs since starting treatment with [place].  Vitamin D deficiency Bernard Roberts has a diagnosis of vitamin D deficiency. Her last vitamin D level was at 6.3 He is not currently taking vit D and denies nausea, vomiting or muscle weakness.  Diabetes II Bernard Roberts has a diagnosis of diabetes type II. Artyom states fasting BGs range between 150 and 180 and 2 hour post prandial BGs are 200+. When he is eating on the diet, his blood sugars are improved. Bernard Roberts denies any hypoglycemic episodes. Last A1c was at 8.8 He has been working on intensive lifestyle modifications including diet, exercise, and weight loss to help control his blood glucose levels.  Hypertriglyceridemia Bernard Roberts has hypertriglyceridemia and he is on Tricor. His last triglyceride value was at 527 and he has been trying to improve his triglyceride levels with intensive lifestyle modification including a low saturated fat diet, exercise and weight loss.   Chronic Kidney Disease Class 3A Bernard Roberts has a diagnosis of chronic kidney disease and his last GFR was at 51.  ASSESSMENT AND PLAN:  Vitamin D deficiency - Plan: Vitamin D, Ergocalciferol, (DRISDOL) 1.25 MG (50000 UT) CAPS capsule  Type 2 diabetes mellitus without complication, with long-term current use of insulin  (HCC)  Hypertriglyceridemia  Stage 3 chronic kidney disease (HCC)  Class 1 obesity with serious comorbidity and body mass index (BMI) of 33.0 to 33.9 in adult, unspecified obesity type  PLAN:  Vitamin D Deficiency Bernard Roberts was informed that low vitamin D levels contributes to fatigue and are associated with obesity, breast, and colon cancer. He agrees to continue to take prescription Vit D '@50' ,000 IU every week #4 with no refills and will follow up for routine testing of vitamin D, at least 2-3 times per year. He was informed of the risk of over-replacement of vitamin D and agrees to not increase his dose unless he discusses this with [place] first. Bernard Roberts agrees to follow up as directed.  Diabetes II Bernard Roberts has been given extensive diabetes education by myself today including ideal fasting and post-prandial blood glucose readings, individual ideal Hgb A1c goals and hypoglycemia prevention. We discussed the importance of good blood sugar control to decrease the likelihood of diabetic complications such as nephropathy, neuropathy, limb loss, blindness, coronary artery disease, and death. We discussed the importance of intensive lifestyle modification including diet, exercise and weight loss as the first line treatment for diabetes. Bernard Roberts will work on increasing lean protein and decreasing simple carbohydrates. He will continue to monitor his fasting blood sugars and 2 hour post prandial blood sugars  and will follow up at the agreed upon time.  Hypertriglyceridemia Bernard Roberts was informed of the American Heart Association Guidelines emphasizing intensive lifestyle modifications as the first line treatment for hypertriglyceridemia. We discussed  many lifestyle modifications today in depth, and [name] will continue to work on decreasing simple carbohydrates,  saturated fats such as fatty red meat, butter and many fried foods. He will also increase vegetables and lean protein in his diet and continue to work on  exercise and weight loss efforts. Bernard Roberts will continue his medications as prescribed and follow up at the agreed upon time.  Chronic Kidney Disease Class 3A We discussed how diabetes type 2 can affect his kidney function.  Obesity Bernard Roberts is currently in the action stage of change. As such, his goal is to continue with weight loss efforts He has agreed to follow the Category 3 plan with 200 to 300 microwave meals Bernard Roberts has been instructed to work up to a goal of 150 minutes of combined cardio and strengthening exercise per week for weight loss and overall health benefits. We discussed the following Behavioral Modification Strategies today: increase H2O intake, keeping healthy foods in the home, increasing lean protein intake, decreasing simple carbohydrates, increasing vegetables and work on meal planning and easy cooking plans Bernard Roberts will go to [employer] if he is out of town.  Bernard Roberts has agreed to follow up with our clinic in 2 weeks. He was informed of the importance of frequent follow up visits to maximize his success with intensive lifestyle modifications for his multiple health conditions.  ALLERGIES: Allergies  Allergen Reactions  . Victoza [Liraglutide] Nausea And Vomiting    MEDICATIONS: Current Outpatient Medications on File Prior to Visit  Medication Sig Dispense Refill  . aspirin EC 81 MG tablet Take 81 mg by mouth daily.    . fenofibrate (TRICOR) 145 MG tablet Take 1 tablet (145 mg total) by mouth daily. 90 tablet 3  . finasteride (PROSCAR) 5 MG tablet Take 5 mg by mouth daily.    Marland Kitchen FREESTYLE TEST STRIPS test strip 1 each by Other route 2 (two) times daily. And lancets 2/day 200 each 3  . ibuprofen (ADVIL,MOTRIN) 600 MG tablet Take 1 tablet (600 mg total) by mouth every 6 (six) hours as needed. 30 tablet 0  . Insulin Glargine (LANTUS SOLOSTAR) 100 UNIT/ML Solostar Pen Inject 60 Units into the skin daily. And pen needles 1/day 15 pen PRN  . INVOKANA 300 MG TABS tablet  TAKE ONE TABLET BY MOUTH EVERY MORNING BEFORE BREAKFAST 30 tablet 1  . Lancets (FREESTYLE) lancets USE TO TEST BLOOD GLUCOSE ONCE DAILY 100 each 3  . losartan (COZAAR) 100 MG tablet Take 1 tablet (100 mg total) by mouth daily. 90 tablet 3  . metFORMIN (GLUCOPHAGE-XR) 500 MG 24 hr tablet TAKE TWO TABLETS BY MOUTH EVERY MORNING AND TAKE THREE TABLETS BY MOUTH EVERY EVENING (Patient taking differently: TAKE TWO TABLETS BY MOUTH EVERY MORNING AND TAKE TWO TABLETS BY MOUTH EVERY EVENING) 120 tablet 0  . sildenafil (VIAGRA) 50 MG tablet Take 1-2 tablets as needed 30 tablet 6  . tamsulosin (FLOMAX) 0.4 MG CAPS capsule TAKE ONE CAPSULE BY MOUTH DAILY 90 capsule 1  . testosterone cypionate (DEPOTESTOSTERONE CYPIONATE) 200 MG/ML injection Inject 200 mg into the muscle every 14 (fourteen) days.    . TRULICITY 1.5 TM/5.4YT SOPN INJECT 1.5MG INTO THE SKIN ONCE A WEEK 1 pen 10  . zolpidem (AMBIEN) 5 MG tablet Take 1 tablet (5 mg total) by mouth at bedtime. 30 tablet 2   No current facility-administered medications on file prior to visit.     PAST MEDICAL HISTORY: Past Medical History:  Diagnosis Date  . Anxiety   .  Arthritis of right knee   . Back pain   . Depression   . Diabetes mellitus without complication ([facility])   . Elevated PSA    Around 15   . Enlarged prostate   . Gout   . Hyperlipidemia   . Hypertension   . Joint pain   . Shortness of breath     PAST SURGICAL HISTORY: Past Surgical History:  Procedure Laterality Date  . KNEE ARTHROPLASTY     unknown which side    SOCIAL HISTORY: Social History   Tobacco Use  . Smoking status: Never Smoker  . Smokeless tobacco: Never Used  . Tobacco comment: smoked over 100 cigerettes per lifetime  Substance Use Topics  . Alcohol use: Yes    Alcohol/week: 0.0 standard drinks    Comment: not a drinker 3 to 4 a week   . Drug use: No    FAMILY HISTORY: Family History  Problem Relation Age of Onset  . Depression Mother   . Anxiety disorder  Mother   . Obesity Mother   . Diabetes Father   . Heart disease Father   . Hypertension Father   . Stroke Father   . High Cholesterol Father   . Depression Father   . Obesity Father   . Diabetes Sister   . Mental illness Sister        Parnoid   . Alcohol abuse Brother   . Hypertension Brother     ROS: Review of Systems  Constitutional: Positive for weight loss.  Gastrointestinal: Negative for nausea and vomiting.  Musculoskeletal:       Negative for muscle weakness    PHYSICAL EXAM: Pulse 87, temperature 98 F (36.7 C), temperature source Oral, height '5\' 8"'  (1.727 m), weight 199 lb (90.3 kg), SpO2 90 %. Body mass index is 30.26 kg/m. Physical Exam Vitals signs reviewed.  Constitutional:      Appearance: Normal appearance. He is well-developed. He is obese.  Cardiovascular:     Rate and Rhythm: Normal rate.  Pulmonary:     Effort: Pulmonary effort is normal.  Musculoskeletal: Normal range of motion.  Skin:    General: Skin is warm and dry.  Neurological:     Mental Status: He is alert and oriented to person, place, and time.  Psychiatric:        Mood and Affect: Mood normal.        Behavior: Behavior normal.     RECENT LABS AND TESTS: BMET    Component Value Date/Time   NA 138 09/13/2018 0821   K 4.1 09/13/2018 0821   CL 104 09/13/2018 0821   CO2 25 09/13/2018 0821   GLUCOSE 179 (H) 09/13/2018 0821   BUN 32 (H) 09/13/2018 0821   CREATININE 1.43 09/13/2018 0821   CREATININE 1.18 09/28/2014 1221   CALCIUM 9.3 09/13/2018 0821   GFRNONAA 86.03 02/23/2010 1600   GFRAA  10/04/2008 2130    >60        The eGFR has been calculated using the MDRD equation. This calculation has not been validated in all clinical situations. eGFR's persistently <60 mL/min signify possible Chronic Kidney Disease.   Lab Results  Component Value Date   HGBA1C 8.8 (A) 11/13/2018   HGBA1C 7.5 (A) 08/07/2018   HGBA1C 8.9 (A) 06/05/2018   HGBA1C 11.3 (A) 04/04/2018   HGBA1C  9.6 (H) 06/09/2017   No results found for: INSULIN CBC    Component Value Date/Time   WBC 9.1 09/13/2018 0821   RBC  5.78 09/13/2018 0821   HGB 16.5 09/13/2018 0821   HCT 49.5 09/13/2018 0821   PLT 248.0 09/13/2018 0821   MCV 85.6 09/13/2018 0821   MCV 83.0 09/28/2014 1225   MCH 27.2 09/28/2014 1225   MCHC 33.4 09/13/2018 0821   RDW 14.0 09/13/2018 0821   LYMPHSABS 1.8 09/13/2018 0821   MONOABS 0.9 09/13/2018 0821   EOSABS 0.1 09/13/2018 0821   BASOSABS 0.1 09/13/2018 0821   Iron/TIBC/Ferritin/ %Sat No results found for: IRON, TIBC, FERRITIN, IRONPCTSAT Lipid Panel     Component Value Date/Time   CHOL 178 09/13/2018 0821   TRIG (H) 09/13/2018 0821    527.0 Triglyceride is over 400; calculations on Lipids are invalid.   HDL 32.20 (L) 09/13/2018 0821   CHOLHDL 6 09/13/2018 0821   VLDL 44.0 (H) 05/30/2016 0929   LDLDIRECT 96.0 09/13/2018 0821   Hepatic Function Panel     Component Value Date/Time   PROT 6.6 09/13/2018 0821   ALBUMIN 4.2 09/13/2018 0821   AST 16 09/13/2018 0821   ALT 16 09/13/2018 0821   ALKPHOS 75 09/13/2018 0821   BILITOT 0.5 09/13/2018 0821   BILIDIR 0.1 09/13/2018 0821      Component Value Date/Time   TSH 3.02 09/13/2018 0821   TSH 2.78 06/09/2017 1040   TSH 2.40 05/30/2016 0929     Ref. Range 11/07/2018 09:10  Vitamin D, 25-Hydroxy Latest Ref Range: 30.0 - 100.0 ng/mL 6.3 (L)     OBESITY BEHAVIORAL INTERVENTION VISIT  Today's visit was # 2   Starting weight: 201 lbs Starting date: 11/07/2018 Today's weight : 199 lbs Today's date: 11/21/2018 Total lbs lost to date: 2 At least 15 minutes were spent on discussing the following behavioral intervention visit.    11/21/2018  Weight 199 lb (90.3 kg)    ASK: We discussed the diagnosis of obesity with Suzan Garibaldi today and Arnette Norris agreed to give [place] permission to discuss obesity behavioral modification therapy today.  ASSESS: Wilford has the diagnosis of obesity and his BMI today is  30.26 Urho is in the action stage of change   ADVISE: [place] was educated on the multiple health risks of obesity as well as the benefit of weight loss to improve his health. He was advised of the need for long term treatment and the importance of lifestyle modifications to improve his current health and to decrease his risk of future health problems.  AGREE: Multiple dietary modification options and treatment options were discussed and  Gregoire agreed to follow the recommendations documented in the above note.  ARRANGE: Tajah was educated on the importance of frequent visits to treat obesity as outlined per CMS and USPSTF guidelines and agreed to schedule his next follow up appointment today.  Corey Skains, am acting as Location manager for [employer]. Owens Shark, DO  I have reviewed the above documentation for accuracy and completeness, and I agree with the above. -Jearld Lesch, DO

## 2018-11-26 ENCOUNTER — Other Ambulatory Visit: Payer: Self-pay

## 2018-11-26 MED ORDER — DULAGLUTIDE 1.5 MG/0.5ML ~~LOC~~ SOAJ: SUBCUTANEOUS | 10 refills | Status: AC

## 2018-11-27 ENCOUNTER — Other Ambulatory Visit: Payer: Self-pay | Admitting: Adult Health

## 2018-11-28 NOTE — Telephone Encounter (Signed)
Denied.  Filled by Dr. Everardo All 2 days ago.

## 2018-12-06 ENCOUNTER — Encounter (INDEPENDENT_AMBULATORY_CARE_PROVIDER_SITE_OTHER): Payer: Self-pay | Admitting: Bariatrics

## 2018-12-06 ENCOUNTER — Ambulatory Visit (INDEPENDENT_AMBULATORY_CARE_PROVIDER_SITE_OTHER): Payer: PPO | Admitting: Bariatrics

## 2018-12-06 VITALS — BP 123/71 | HR 81 | Temp 97.8°F | Ht 68.0 in | Wt 199.0 lb

## 2018-12-06 DIAGNOSIS — E119 Type 2 diabetes mellitus without complications: Secondary | ICD-10-CM

## 2018-12-06 DIAGNOSIS — Z794 Long term (current) use of insulin: Secondary | ICD-10-CM

## 2018-12-06 DIAGNOSIS — Z6830 Body mass index (BMI) 30.0-30.9, adult: Secondary | ICD-10-CM | POA: Diagnosis not present

## 2018-12-06 DIAGNOSIS — Z9189 Other specified personal risk factors, not elsewhere classified: Secondary | ICD-10-CM | POA: Diagnosis not present

## 2018-12-06 DIAGNOSIS — E669 Obesity, unspecified: Secondary | ICD-10-CM | POA: Diagnosis not present

## 2018-12-06 DIAGNOSIS — E559 Vitamin D deficiency, unspecified: Secondary | ICD-10-CM

## 2018-12-06 MED ORDER — VITAMIN D (ERGOCALCIFEROL) 1.25 MG (50000 UNIT) PO CAPS: 50000.0000 [IU] | ORAL_CAPSULE | ORAL | 0 refills | Status: DC

## 2018-12-10 NOTE — Progress Notes (Signed)
Office: [phone]  /  Fax: [phone]   HPI:   Chief Complaint: OBESITY Bernard Roberts is here to discuss his progress with his obesity treatment plan. He is on the Category 3 plan with 200 to 300 calorie microwave meals and is following his eating plan approximately 50 % of the time. He states he is swimming 30 minutes 3 to 4 times per week. Bernard Roberts is doing well overall. He does a lot of traveling and entertaining. He states that the plan is ok His weight is 199 lb (90.3 kg) today and he has maintained weight over a period of 2 weeks since his last visit. He has lost 2 lbs since starting treatment with [place].  Vitamin D deficiency Bernard Roberts has a diagnosis of vitamin D deficiency. He is currently taking vit D and denies nausea, vomiting or muscle weakness.  At risk for osteopenia and osteoporosis Bernard Roberts is at higher risk of osteopenia and osteoporosis due to vitamin D deficiency.   Diabetes II Bernard Roberts has a diagnosis of diabetes type II. He is currently taking Trulicity, Lantus, Invokana and Metformin. Bernard Roberts states fasting BGs are 180 and he is not checking 12 hour post prandial BGs. Bernard Roberts denies any hypoglycemic episodes. Last A1c was at 8.8 He has been working on intensive lifestyle modifications including diet, exercise, and weight loss to help control his blood glucose levels.  ASSESSMENT AND PLAN:  Vitamin D deficiency - Plan: Vitamin D, Ergocalciferol, (DRISDOL) 1.25 MG (50000 UT) CAPS capsule  Type 2 diabetes mellitus without complication, with long-term current use of insulin (HCC)  At risk for osteoporosis  Class 1 obesity with serious comorbidity and body mass index (BMI) of 30.0 to 30.9 in adult, unspecified obesity type  PLAN:  Vitamin D Deficiency Bernard Roberts was informed that low vitamin D levels contributes to fatigue and are associated with obesity, breast, and colon cancer. He agrees to continue to take prescription Vit D _0 ,000 IU every week #4 with no refills and will  follow up for routine testing of vitamin D, at least 2-3 times per year. He was informed of the risk of over-replacement of vitamin D and agrees to not increase his dose unless he discusses this with [place] first. Bernard Roberts agrees to follow up as directed.  At risk for osteopenia and osteoporosis Bernard Roberts was given extended  (15 minutes) osteoporosis prevention counseling today. Bernard Roberts is at risk for osteopenia and osteoporosis due to his vitamin D deficiency. He was encouraged to take his vitamin D and follow his higher calcium diet and increase strengthening exercise to help strengthen his bones and decrease his risk of osteopenia and osteoporosis.  Diabetes II Bernard Roberts has been given extensive diabetes education by myself today including ideal fasting and post-prandial blood glucose readings, individual ideal Hgb A1c goals and hypoglycemia prevention. We discussed the importance of good blood sugar control to decrease the likelihood of diabetic complications such as nephropathy, neuropathy, limb loss, blindness, coronary artery disease, and death. We discussed the importance of intensive lifestyle modification including diet, exercise and weight loss as the first line treatment for diabetes. Bernard Roberts agrees to continue his diabetes medications and will follow up at the agreed upon time.  Obesity Bernard Roberts is currently in the action stage of change. As such, his goal is to continue with weight loss efforts He has agreed to follow the Category 3 plan Bernard Roberts has been instructed to work up to a goal of 150 minutes of combined cardio and strengthening exercise per week for weight loss and overall  health benefits. We discussed the following Behavioral Modification Strategies today: increase H2O intake, no skipping meals, keeping healthy foods in the home, increasing lean protein intake, decreasing simple carbohydrates, increasing vegetables, decrease eating out and work on meal planning and easy cooking plans  Bernard Roberts  has agreed to follow up with our clinic in 2 weeks. He was informed of the importance of frequent follow up visits to maximize his success with intensive lifestyle modifications for his multiple health conditions.  ALLERGIES: Allergies  Allergen Reactions   Victoza [Liraglutide] Nausea And Vomiting    MEDICATIONS: Current Outpatient Medications on File Prior to Visit  Medication Sig Dispense Refill   aspirin EC 81 MG tablet Take 81 mg by mouth daily.     Dulaglutide (TRULICITY) 1.5 BZ/1.6RC SOPN INJECT 1.5MG INTO THE SKIN ONCE A WEEK 1 pen 10   fenofibrate (TRICOR) 145 MG tablet Take 1 tablet (145 mg total) by mouth daily. 90 tablet 3   finasteride (PROSCAR) 5 MG tablet Take 5 mg by mouth daily.     FREESTYLE TEST STRIPS test strip 1 each by Other route 2 (two) times daily. And lancets 2/day 200 each 3   ibuprofen (ADVIL,MOTRIN) 600 MG tablet Take 1 tablet (600 mg total) by mouth every 6 (six) hours as needed. 30 tablet 0   Insulin Glargine (LANTUS SOLOSTAR) 100 UNIT/ML Solostar Pen Inject 60 Units into the skin daily. And pen needles 1/day 15 pen PRN   INVOKANA 300 MG TABS tablet TAKE ONE TABLET BY MOUTH EVERY MORNING BEFORE BREAKFAST 30 tablet 1   Lancets (FREESTYLE) lancets USE TO TEST BLOOD GLUCOSE ONCE DAILY 100 each 3   losartan (COZAAR) 100 MG tablet Take 1 tablet (100 mg total) by mouth daily. 90 tablet 3   metFORMIN (GLUCOPHAGE-XR) 500 MG 24 hr tablet TAKE TWO TABLETS BY MOUTH EVERY MORNING AND TAKE THREE TABLETS BY MOUTH EVERY EVENING (Patient taking differently: TAKE TWO TABLETS BY MOUTH EVERY MORNING AND TAKE TWO TABLETS BY MOUTH EVERY EVENING) 120 tablet 0   sildenafil (VIAGRA) 50 MG tablet Take 1-2 tablets as needed 30 tablet 6   tamsulosin (FLOMAX) 0.4 MG CAPS capsule TAKE ONE CAPSULE BY MOUTH DAILY 90 capsule 1   testosterone cypionate (DEPOTESTOSTERONE CYPIONATE) 200 MG/ML injection Inject 200 mg into the muscle every 14 (fourteen) days.     zolpidem (AMBIEN)  5 MG tablet Take 1 tablet (5 mg total) by mouth at bedtime. 30 tablet 2   No current facility-administered medications on file prior to visit.     PAST MEDICAL HISTORY: Past Medical History:  Diagnosis Date   Anxiety    Arthritis of right knee    Back pain    Depression    Diabetes mellitus without complication (HCC)    Elevated PSA    Around 14    Enlarged prostate    Gout    Hyperlipidemia    Hypertension    Joint pain    Shortness of breath     PAST SURGICAL HISTORY: Past Surgical History:  Procedure Laterality Date   KNEE ARTHROPLASTY     unknown which side    SOCIAL HISTORY: Social History   Tobacco Use   Smoking status: Never Smoker   Smokeless tobacco: Never Used   Tobacco comment: smoked over 100 cigerettes per lifetime  Substance Use Topics   Alcohol use: Yes    Alcohol/week: 0.0 standard drinks    Comment: not a drinker 3 to 4 a week    Drug use:  No    FAMILY HISTORY: Family History  Problem Relation Age of Onset   Depression Mother    Anxiety disorder Mother    Obesity Mother    Diabetes Father    Heart disease Father    Hypertension Father    Stroke Father    High Cholesterol Father    Depression Father    Obesity Father    Diabetes Sister    Mental illness Sister        Parnoid    Alcohol abuse Brother    Hypertension Brother     ROS: Review of Systems  Constitutional: Negative for weight loss.  Gastrointestinal: Negative for nausea and vomiting.  Musculoskeletal:       Negative for muscle weakness  Endo/Heme/Allergies:       Negative for hypoglycemia    PHYSICAL EXAM: Blood pressure 123/71, pulse 81, temperature 97.8 F (36.6 C), temperature source Oral, height _0  (1.727 m), weight 199 lb (90.3 kg), SpO2 96 %. Body mass index is 30.26 kg/m. Physical Exam Vitals signs reviewed.  Constitutional:      Appearance: Normal appearance. He is well-developed. He is obese.  Cardiovascular:      Rate and Rhythm: Normal rate.  Pulmonary:     Effort: Pulmonary effort is normal.  Musculoskeletal: Normal range of motion.  Skin:    General: Skin is warm and dry.  Neurological:     Mental Status: He is alert and oriented to person, place, and time.  Psychiatric:        Mood and Affect: Mood normal.        Behavior: Behavior normal.     RECENT LABS AND TESTS: BMET    Component Value Date/Time   NA 138 09/13/2018 0821   K 4.1 09/13/2018 0821   CL 104 09/13/2018 0821   CO2 25 09/13/2018 0821   GLUCOSE 179 (H) 09/13/2018 0821   BUN 32 (H) 09/13/2018 0821   CREATININE 1.43 09/13/2018 0821   CREATININE 1.18 09/28/2014 1221   CALCIUM 9.3 09/13/2018 0821   GFRNONAA 86.03 02/23/2010 1600   GFRAA  10/04/2008 2130    >60        The eGFR has been calculated using the MDRD equation. This calculation has not been validated in all clinical situations. eGFR's persistently <60 mL/min signify possible Chronic Kidney Disease.   Lab Results  Component Value Date   HGBA1C 8.8 (A) 11/13/2018   HGBA1C 7.5 (A) 08/07/2018   HGBA1C 8.9 (A) 06/05/2018   HGBA1C 11.3 (A) 04/04/2018   HGBA1C 9.6 (H) 06/09/2017   No results found for: INSULIN CBC    Component Value Date/Time   WBC 9.1 09/13/2018 0821   RBC 5.78 09/13/2018 0821   HGB 16.5 09/13/2018 0821   HCT 49.5 09/13/2018 0821   PLT 248.0 09/13/2018 0821   MCV 85.6 09/13/2018 0821   MCV 83.0 09/28/2014 1225   MCH 27.2 09/28/2014 1225   MCHC 33.4 09/13/2018 0821   RDW 14.0 09/13/2018 0821   LYMPHSABS 1.8 09/13/2018 0821   MONOABS 0.9 09/13/2018 0821   EOSABS 0.1 09/13/2018 0821   BASOSABS 0.1 09/13/2018 0821   Iron/TIBC/Ferritin/ %Sat No results found for: IRON, TIBC, FERRITIN, IRONPCTSAT Lipid Panel     Component Value Date/Time   CHOL 178 09/13/2018 0821   TRIG (H) 09/13/2018 0821    527.0 Triglyceride is over 400; calculations on Lipids are invalid.   HDL 32.20 (L) 09/13/2018 0821   CHOLHDL 6 09/13/2018 6226  VLDL 44.0 (H) 05/30/2016 0929   LDLDIRECT 96.0 09/13/2018 0821   Hepatic Function Panel     Component Value Date/Time   PROT 6.6 09/13/2018 0821   ALBUMIN 4.2 09/13/2018 0821   AST 16 09/13/2018 0821   ALT 16 09/13/2018 0821   ALKPHOS 75 09/13/2018 0821   BILITOT 0.5 09/13/2018 0821   BILIDIR 0.1 09/13/2018 0821      Component Value Date/Time   TSH 3.02 09/13/2018 0821   TSH 2.78 06/09/2017 1040   TSH 2.40 05/30/2016 0929     Ref. Range 11/07/2018 09:10  Vitamin D, 25-Hydroxy Latest Ref Range: 30.0 - 100.0 ng/mL 6.3 (L)     OBESITY BEHAVIORAL INTERVENTION VISIT  Today's visit was # 3   Starting weight: 201 lbs Starting date: 11/07/2018 Today's weight : 199 lbs Today's date: 12/06/2018 Total lbs lost to date: 2    12/06/2018  Height _0  (1.727 m)  Weight 199 lb (90.3 kg)  BMI (Calculated) 30.26  BLOOD PRESSURE - SYSTOLIC 768  BLOOD PRESSURE - DIASTOLIC 71   Body Fat % 11.5 %  Total Body Water (lbs) 99.2 lbs    ASK: We discussed the diagnosis of obesity with Suzan Garibaldi today and Arnette Norris agreed to give [place] permission to discuss obesity behavioral modification therapy today.  ASSESS: Link has the diagnosis of obesity and his BMI today is 30.26 Tyeson is in the action stage of change   ADVISE: Giannis was educated on the multiple health risks of obesity as well as the benefit of weight loss to improve his health. He was advised of the need for long term treatment and the importance of lifestyle modifications to improve his current health and to decrease his risk of future health problems.  AGREE: Multiple dietary modification options and treatment options were discussed and  Griffon agreed to follow the recommendations documented in the above note.  ARRANGE: Coral was educated on the importance of frequent visits to treat obesity as outlined per CMS and USPSTF guidelines and agreed to schedule his next follow up appointment today.  Corey Skains, am acting as  Location manager for [employer]. Owens Shark, DO  I have reviewed the above documentation for accuracy and completeness, and I agree with the above. -Jearld Lesch, DO

## 2018-12-11 DIAGNOSIS — E559 Vitamin D deficiency, unspecified: Secondary | ICD-10-CM | POA: Insufficient documentation

## 2018-12-21 ENCOUNTER — Other Ambulatory Visit: Payer: Self-pay | Admitting: Endocrinology

## 2018-12-21 ENCOUNTER — Other Ambulatory Visit: Payer: Self-pay | Admitting: Adult Health

## 2018-12-24 ENCOUNTER — Encounter (INDEPENDENT_AMBULATORY_CARE_PROVIDER_SITE_OTHER): Payer: Self-pay

## 2018-12-24 ENCOUNTER — Encounter (INDEPENDENT_AMBULATORY_CARE_PROVIDER_SITE_OTHER): Payer: Self-pay | Admitting: Bariatrics

## 2018-12-25 ENCOUNTER — Encounter (INDEPENDENT_AMBULATORY_CARE_PROVIDER_SITE_OTHER): Payer: Self-pay

## 2018-12-25 ENCOUNTER — Ambulatory Visit (INDEPENDENT_AMBULATORY_CARE_PROVIDER_SITE_OTHER): Payer: PPO | Admitting: Bariatrics

## 2018-12-26 ENCOUNTER — Other Ambulatory Visit: Payer: Self-pay

## 2018-12-26 ENCOUNTER — Ambulatory Visit (INDEPENDENT_AMBULATORY_CARE_PROVIDER_SITE_OTHER): Payer: PPO | Admitting: Bariatrics

## 2018-12-26 ENCOUNTER — Encounter (INDEPENDENT_AMBULATORY_CARE_PROVIDER_SITE_OTHER): Payer: Self-pay | Admitting: Bariatrics

## 2018-12-26 DIAGNOSIS — Z794 Long term (current) use of insulin: Secondary | ICD-10-CM | POA: Diagnosis not present

## 2018-12-26 DIAGNOSIS — E119 Type 2 diabetes mellitus without complications: Secondary | ICD-10-CM | POA: Diagnosis not present

## 2018-12-26 DIAGNOSIS — E781 Pure hyperglyceridemia: Secondary | ICD-10-CM

## 2018-12-26 DIAGNOSIS — E559 Vitamin D deficiency, unspecified: Secondary | ICD-10-CM | POA: Diagnosis not present

## 2018-12-26 DIAGNOSIS — Z6830 Body mass index (BMI) 30.0-30.9, adult: Secondary | ICD-10-CM | POA: Diagnosis not present

## 2018-12-26 DIAGNOSIS — E669 Obesity, unspecified: Secondary | ICD-10-CM

## 2018-12-27 ENCOUNTER — Encounter (INDEPENDENT_AMBULATORY_CARE_PROVIDER_SITE_OTHER): Payer: Self-pay

## 2018-12-27 NOTE — Progress Notes (Addendum)
Office: [phone]  /  Fax: [phone] TeleHealth Visit:  Bernard Roberts has consented to this TeleHealth visit today via telephone call. The patient is located at home, the provider is located at the [facility] office. The participants in this visit include the listed provider and patient and any and all parties involved.   HPI:   Chief Complaint: OBESITY Bernard Roberts is here to discuss his progress with his obesity treatment plan. He is on the Category 3 plan and is following his eating plan approximately 65 % of the time. He states he is walking 0 to 15 minutes 7 times per week. Bernard Roberts is doing well and he believes that he has lost 2 pounds per his scales at home. He feels satisfied on the plan. He is using the "MyFitnessPal" application. We were unable to weight the patient today for this TeleHealth visit.He feels as if he has lost weight since his last visit. He has lost 4 lbs since starting treatment with [place].  Vitamin D deficiency Bernard Roberts has a diagnosis of vitamin D deficiency. He is currently taking vit D and denies nausea, vomiting or muscle weakness.  Diabetes II Bernard Roberts has a diagnosis of diabetes type II. He is taking Trulicity, Lantus approximately 60 units, Invokana and Metformin. Bernard Roberts states his morning fasting blood sugar is 153 and is improving overall. Bernard Roberts has one hypoglycemic episode daily. Last A1c was at 8.8 He has been working on intensive lifestyle modifications including diet, exercise, and weight loss to help control his blood glucose levels.  Hypertriglyceridemia Bernard Roberts has hypertriglyceridemia and he is currently taking Fenofibrate. He has been trying to improve his cholesterol levels with intensive lifestyle modification including a low saturated fat diet, exercise and weight loss. He denies myalgias.  ASSESSMENT AND PLAN:  Vitamin D deficiency  Type 2 diabetes mellitus without complication, with long-term current use of insulin (HCC)   Hypertriglyceridemia  Class 1 obesity with serious comorbidity and body mass index (BMI) of 30.0 to 30.9 in adult, unspecified obesity type  PLAN:  Vitamin D Deficiency Bernard Roberts was informed that low vitamin D levels contributes to fatigue and are associated with obesity, breast, and colon cancer. He agrees to continue to take prescription Vit D '@50' ,000 IU every week and will follow up for routine testing of vitamin D, at least 2-3 times per year. He was informed of the risk of over-replacement of vitamin D and agrees to not increase his dose unless he discusses this with [place] first.  Diabetes II Bernard Roberts has been given extensive diabetes education by myself today including ideal fasting and post-prandial blood glucose readings, individual ideal Hgb A1c goals and hypoglycemia prevention. We discussed the importance of good blood sugar control to decrease the likelihood of diabetic complications such as nephropathy, neuropathy, limb loss, blindness, coronary artery disease, and death. We discussed the importance of intensive lifestyle modification including diet, exercise and weight loss as the first line treatment for diabetes. Bernard Roberts will continue to check his fasting blood sugar and 2 hour post prandial blood sugar. Bernard Roberts agrees to continue all of his diabetes medications and follow up at the agreed upon time.  Hypertriglyceridemia Bernard Roberts was informed of the American Heart Association Guidelines emphasizing intensive lifestyle modifications as the first line treatment for hyperlipidemia. We discussed many lifestyle modifications today in depth, and Dalvin will continue to work on decreasing saturated fats such as fatty red meat, butter and many fried foods. He will also increase monounsaturated fatty acids, polyunsaturated fatty acids, vegetables  and lean protein in his diet and continue to work on exercise and weight loss efforts. Bernard Roberts will continue Fenofibrate and follow up as directed.  Obesity  Bernard Roberts is currently in the action stage of change. As such, his goal is to continue with weight loss efforts He has agreed to portion control better and make smarter food choices, such as increase vegetables and decrease simple carbohydrates and follow the Category 3 plan Bernard Roberts has been instructed to work up to a goal of 150 minutes of combined cardio and strengthening exercise per week or walking the dog for weight loss and overall health benefits. We discussed the following Behavioral Modification Strategies today: planning for success, increase H2O intake, no skipping meals, keeping healthy foods in the home, increasing lean protein intake, decreasing simple carbohydrates, increasing vegetables, decrease eating out and work on meal planning and easy cooking plans  Bernard Roberts has agreed to follow up with our clinic in 2 weeks. He was informed of the importance of frequent follow up visits to maximize his success with intensive lifestyle modifications for his multiple health conditions.  ALLERGIES: Allergies  Allergen Reactions  . Victoza [Liraglutide] Nausea And Vomiting    MEDICATIONS: Current Outpatient Medications on File Prior to Visit  Medication Sig Dispense Refill  . aspirin EC 81 MG tablet Take 81 mg by mouth daily.    . Dulaglutide (TRULICITY) 1.5 GG/2.6RS SOPN INJECT 1.5MG INTO THE SKIN ONCE A WEEK 1 pen 10  . fenofibrate (TRICOR) 145 MG tablet Take 1 tablet (145 mg total) by mouth daily. 90 tablet 3  . finasteride (PROSCAR) 5 MG tablet Take 5 mg by mouth daily.    Marland Kitchen FREESTYLE TEST STRIPS test strip 1 each by Other route 2 (two) times daily. And lancets 2/day 200 each 3  . ibuprofen (ADVIL,MOTRIN) 600 MG tablet Take 1 tablet (600 mg total) by mouth every 6 (six) hours as needed. 30 tablet 0  . Insulin Glargine (LANTUS SOLOSTAR) 100 UNIT/ML Solostar Pen Inject 60 Units into the skin daily. And pen needles 1/day 15 pen PRN  . INVOKANA 300 MG TABS tablet TAKE ONE TABLET BY MOUTH EVERY  MORNING BEFORE BREAKFAST 30 tablet 1  . Lancets (FREESTYLE) lancets USE TO TEST BLOOD GLUCOSE ONCE DAILY 100 each 3  . losartan (COZAAR) 100 MG tablet Take 1 tablet (100 mg total) by mouth daily. 90 tablet 3  . metFORMIN (GLUCOPHAGE-XR) 500 MG 24 hr tablet TAKE TWO TABLETS BY MOUTH EVERY MORNING AND TAKE THREE TABLETS BY MOUTH EVERY EVENING 120 tablet 0  . sildenafil (VIAGRA) 50 MG tablet Take 1-2 tablets as needed 30 tablet 6  . tamsulosin (FLOMAX) 0.4 MG CAPS capsule TAKE ONE CAPSULE BY MOUTH DAILY 90 capsule 1  . testosterone cypionate (DEPOTESTOSTERONE CYPIONATE) 200 MG/ML injection Inject 200 mg into the muscle every 14 (fourteen) days.    . Vitamin D, Ergocalciferol, (DRISDOL) 1.25 MG (50000 UT) CAPS capsule Take 1 capsule (50,000 Units total) by mouth every 7 (seven) days. 4 capsule 0  . zolpidem (AMBIEN) 5 MG tablet TAKE ONE TABLET BY MOUTH EVERY NIGHT AT BEDTIME 30 tablet 1   No current facility-administered medications on file prior to visit.     PAST MEDICAL HISTORY: Past Medical History:  Diagnosis Date  . Anxiety   . Arthritis of right knee   . Back pain   . Depression   . Diabetes mellitus without complication ([employer])   . Elevated PSA    Around 14   . Enlarged prostate   .  Gout   . Hyperlipidemia   . Hypertension   . Joint pain   . Shortness of breath     PAST SURGICAL HISTORY: Past Surgical History:  Procedure Laterality Date  . KNEE ARTHROPLASTY     unknown which side    SOCIAL HISTORY: Social History   Tobacco Use  . Smoking status: Never Smoker  . Smokeless tobacco: Never Used  . Tobacco comment: smoked over 100 cigerettes per lifetime  Substance Use Topics  . Alcohol use: Yes    Alcohol/week: 0.0 standard drinks    Comment: not a drinker 3 to 4 a week   . Drug use: No    FAMILY HISTORY: Family History  Problem Relation Age of Onset  . Depression Mother   . Anxiety disorder Mother   . Obesity Mother   . Diabetes Father   . Heart disease  Father   . Hypertension Father   . Stroke Father   . High Cholesterol Father   . Depression Father   . Obesity Father   . Diabetes Sister   . Mental illness Sister        Parnoid   . Alcohol abuse Brother   . Hypertension Brother     ROS: Review of Systems  Constitutional: Positive for weight loss.  Gastrointestinal: Negative for nausea and vomiting.  Musculoskeletal: Negative for myalgias.       Negative for muscle weakness    PHYSICAL EXAM: Pt in no acute distress  RECENT LABS AND TESTS: BMET    Component Value Date/Time   NA 138 09/13/2018 0821   K 4.1 09/13/2018 0821   CL 104 09/13/2018 0821   CO2 25 09/13/2018 0821   GLUCOSE 179 (H) 09/13/2018 0821   BUN 32 (H) 09/13/2018 0821   CREATININE 1.43 09/13/2018 0821   CREATININE 1.18 09/28/2014 1221   CALCIUM 9.3 09/13/2018 0821   GFRNONAA 86.03 02/23/2010 1600   GFRAA  10/04/2008 2130    >60        The eGFR has been calculated using the MDRD equation. This calculation has not been validated in all clinical situations. eGFR's persistently <60 mL/min signify possible Chronic Kidney Disease.   Lab Results  Component Value Date   HGBA1C 8.8 (A) 11/13/2018   HGBA1C 7.5 (A) 08/07/2018   HGBA1C 8.9 (A) 06/05/2018   HGBA1C 11.3 (A) 04/04/2018   HGBA1C 9.6 (H) 06/09/2017   No results found for: INSULIN CBC    Component Value Date/Time   WBC 9.1 09/13/2018 0821   RBC 5.78 09/13/2018 0821   HGB 16.5 09/13/2018 0821   HCT 49.5 09/13/2018 0821   PLT 248.0 09/13/2018 0821   MCV 85.6 09/13/2018 0821   MCV 83.0 09/28/2014 1225   MCH 27.2 09/28/2014 1225   MCHC 33.4 09/13/2018 0821   RDW 14.0 09/13/2018 0821   LYMPHSABS 1.8 09/13/2018 0821   MONOABS 0.9 09/13/2018 0821   EOSABS 0.1 09/13/2018 0821   BASOSABS 0.1 09/13/2018 0821   Iron/TIBC/Ferritin/ %Sat No results found for: IRON, TIBC, FERRITIN, IRONPCTSAT Lipid Panel     Component Value Date/Time   CHOL 178 09/13/2018 0821   TRIG (H) 09/13/2018 0821     527.0 Triglyceride is over 400; calculations on Lipids are invalid.   HDL 32.20 (L) 09/13/2018 0821   CHOLHDL 6 09/13/2018 0821   VLDL 44.0 (H) 05/30/2016 0929   LDLDIRECT 96.0 09/13/2018 0821   Hepatic Function Panel     Component Value Date/Time   PROT 6.6 09/13/2018 0821  ALBUMIN 4.2 09/13/2018 0821   AST 16 09/13/2018 0821   ALT 16 09/13/2018 0821   ALKPHOS 75 09/13/2018 0821   BILITOT 0.5 09/13/2018 0821   BILIDIR 0.1 09/13/2018 0821      Component Value Date/Time   TSH 3.02 09/13/2018 0821   TSH 2.78 06/09/2017 1040   TSH 2.40 05/30/2016 0929     Ref. Range 11/07/2018 09:10  Vitamin D, 25-Hydroxy Latest Ref Range: 30.0 - 100.0 ng/mL 6.3 (L)     I, Doreene Nest, am acting as Location manager for [employer]. Owens Shark, DO  I have reviewed the above documentation for accuracy and completeness, and I agree with the above. -Jearld Lesch, DO

## 2018-12-31 DIAGNOSIS — E781 Pure hyperglyceridemia: Secondary | ICD-10-CM | POA: Insufficient documentation

## 2019-01-01 ENCOUNTER — Other Ambulatory Visit: Payer: Self-pay | Admitting: Endocrinology

## 2019-01-10 ENCOUNTER — Encounter (INDEPENDENT_AMBULATORY_CARE_PROVIDER_SITE_OTHER): Payer: Self-pay | Admitting: Bariatrics

## 2019-01-10 ENCOUNTER — Ambulatory Visit (INDEPENDENT_AMBULATORY_CARE_PROVIDER_SITE_OTHER): Payer: PPO | Admitting: Bariatrics

## 2019-01-10 ENCOUNTER — Other Ambulatory Visit: Payer: Self-pay

## 2019-01-10 DIAGNOSIS — E559 Vitamin D deficiency, unspecified: Secondary | ICD-10-CM

## 2019-01-10 DIAGNOSIS — E119 Type 2 diabetes mellitus without complications: Secondary | ICD-10-CM

## 2019-01-10 DIAGNOSIS — E669 Obesity, unspecified: Secondary | ICD-10-CM | POA: Diagnosis not present

## 2019-01-10 DIAGNOSIS — Z6830 Body mass index (BMI) 30.0-30.9, adult: Secondary | ICD-10-CM | POA: Diagnosis not present

## 2019-01-10 DIAGNOSIS — Z794 Long term (current) use of insulin: Secondary | ICD-10-CM

## 2019-01-10 MED ORDER — VITAMIN D (ERGOCALCIFEROL) 1.25 MG (50000 UNIT) PO CAPS: 50000.0000 [IU] | ORAL_CAPSULE | ORAL | 0 refills | Status: DC

## 2019-01-14 ENCOUNTER — Encounter (INDEPENDENT_AMBULATORY_CARE_PROVIDER_SITE_OTHER): Payer: Self-pay | Admitting: Bariatrics

## 2019-01-14 NOTE — Progress Notes (Signed)
Office: [phone]  /  Fax: [phone] TeleHealth Visit:  Bernard Roberts has verbally consented to this TeleHealth visit today. The patient is located at home, the provider is located at the [facility] office. The participants in this visit include the listed provider and patient and any and all parties involved. The visit was conducted today via WebEx.  HPI:   Chief Complaint: OBESITY Bernard Roberts is here to discuss his progress with his obesity treatment plan. He is on the Category 3 plan and is following his eating plan approximately 55 % of the time. He states he is walking for 25 minutes 5 times per week. Bernard Roberts has lost 2 pounds since his last visit. Bernard Roberts is eating well. We were unable to weigh the patient today for this TeleHealth visit. He feels as if he has lost weight since his last visit. He has lost 6 lbs since starting treatment with [place].  Vitamin D deficiency Bernard Roberts has a diagnosis of vitamin D deficiency. His last vitamin D level was at 6.3 He is currently taking vit D and denies nausea, vomiting or muscle weakness.  Diabetes II Bernard Roberts has a diagnosis of diabetes type II. He is taking Metformin, Lantus 50 units, Invokana and Dulaglutide. Bernard Roberts states fasting BGs are 160 and he denies any hypoglycemic episodes. Last A1c was at 8.8 He has been working on intensive lifestyle modifications including diet, exercise, and weight loss to help control his blood glucose levels.  ASSESSMENT AND PLAN:  Vitamin D deficiency - Plan: Vitamin D, Ergocalciferol, (DRISDOL) 1.25 MG (50000 UT) CAPS capsule  Type 2 diabetes mellitus without complication, with long-term current use of insulin (HCC)  Class 1 obesity with serious comorbidity and body mass index (BMI) of 30.0 to 30.9 in adult, unspecified obesity type  PLAN:  Vitamin D Deficiency Trig was informed that low vitamin D levels contributes to fatigue and are associated with obesity, breast, and colon cancer. He  agrees to continue to take prescription Vit D _0 ,000 IU every week #4 with no refills and will follow up for routine testing of vitamin D, at least 2-3 times per year. He was informed of the risk of over-replacement of vitamin D and agrees to not increase his dose unless he discusses this with [place] first. Algernon agrees to follow up as directed.  Diabetes II Bernard Roberts has been given extensive diabetes education by myself today including ideal fasting and post-prandial blood glucose readings, individual ideal Hgb A1c goals and hypoglycemia prevention. We discussed the importance of good blood sugar control to decrease the likelihood of diabetic complications such as nephropathy, neuropathy, limb loss, blindness, coronary artery disease, and death. We discussed the importance of intensive lifestyle modification including diet, exercise and weight loss as the first line treatment for diabetes. Khrystian agrees to continue his diabetes medications and will follow up at the agreed upon time.  Obesity Bernard Roberts is currently in the action stage of change. As such, his goal is to continue with weight loss efforts He has agreed to follow the Category 3 plan Bernard Roberts will continue exercise for weight loss and overall health benefits. We discussed the following Behavioral Modification Strategies today: increase H2O intake, no skipping meals, keeping healthy foods in the home, increasing lean protein intake, decreasing simple carbohydrates, increasing vegetables, decrease eating out and work on meal planning and easy cooking plans Bernard Roberts will weigh himself prior to his visit.  Bernard Roberts has agreed to follow up with our clinic in 2 weeks. He was informed  of the importance of frequent follow up visits to maximize his success with intensive lifestyle modifications for his multiple health conditions.  ALLERGIES: Allergies  Allergen Reactions  . Victoza [Liraglutide] Nausea And Vomiting    MEDICATIONS: Current Outpatient  Medications on File Prior to Visit  Medication Sig Dispense Refill  . aspirin EC 81 MG tablet Take 81 mg by mouth daily.    . Dulaglutide (TRULICITY) 1.5 IO/2.7OJ SOPN INJECT 1.5MG INTO THE SKIN ONCE A WEEK 1 pen 10  . fenofibrate (TRICOR) 145 MG tablet Take 1 tablet (145 mg total) by mouth daily. 90 tablet 3  . finasteride (PROSCAR) 5 MG tablet Take 5 mg by mouth daily.    Marland Kitchen FREESTYLE TEST STRIPS test strip 1 each by Other route 2 (two) times daily. And lancets 2/day 200 each 3  . ibuprofen (ADVIL,MOTRIN) 600 MG tablet Take 1 tablet (600 mg total) by mouth every 6 (six) hours as needed. 30 tablet 0  . Insulin Glargine (LANTUS SOLOSTAR) 100 UNIT/ML Solostar Pen Inject 60 Units into the skin daily. And pen needles 1/day 15 pen PRN  . INVOKANA 300 MG TABS tablet TAKE ONE TABLET BY MOUTH EVERY MORNING BEFORE BREAKFAST 30 tablet 0  . Lancets (FREESTYLE) lancets USE TO TEST BLOOD GLUCOSE ONCE DAILY 100 each 3  . losartan (COZAAR) 100 MG tablet Take 1 tablet (100 mg total) by mouth daily. 90 tablet 3  . metFORMIN (GLUCOPHAGE-XR) 500 MG 24 hr tablet TAKE TWO TABLETS BY MOUTH EVERY MORNING AND TAKE THREE TABLETS BY MOUTH EVERY EVENING 120 tablet 0  . sildenafil (VIAGRA) 50 MG tablet Take 1-2 tablets as needed 30 tablet 6  . tamsulosin (FLOMAX) 0.4 MG CAPS capsule TAKE ONE CAPSULE BY MOUTH DAILY 90 capsule 1  . testosterone cypionate (DEPOTESTOSTERONE CYPIONATE) 200 MG/ML injection Inject 200 mg into the muscle every 14 (fourteen) days.    Marland Kitchen zolpidem (AMBIEN) 5 MG tablet TAKE ONE TABLET BY MOUTH EVERY NIGHT AT BEDTIME 30 tablet 1   No current facility-administered medications on file prior to visit.     PAST MEDICAL HISTORY: Past Medical History:  Diagnosis Date  . Anxiety   . Arthritis of right knee   . Back pain   . Depression   . Diabetes mellitus without complication ([place])   . Elevated PSA    Around 15   . Enlarged prostate   . Gout   . Hyperlipidemia   . Hypertension   . Joint pain    . Shortness of breath     PAST SURGICAL HISTORY: Past Surgical History:  Procedure Laterality Date  . KNEE ARTHROPLASTY     unknown which side    SOCIAL HISTORY: Social History   Tobacco Use  . Smoking status: Never Smoker  . Smokeless tobacco: Never Used  . Tobacco comment: smoked over 100 cigerettes per lifetime  Substance Use Topics  . Alcohol use: Yes    Alcohol/week: 0.0 standard drinks    Comment: not a drinker 3 to 4 a week   . Drug use: No    FAMILY HISTORY: Family History  Problem Relation Age of Onset  . Depression Mother   . Anxiety disorder Mother   . Obesity Mother   . Diabetes Father   . Heart disease Father   . Hypertension Father   . Stroke Father   . High Cholesterol Father   . Depression Father   . Obesity Father   . Diabetes Sister   . Mental illness Sister  Parnoid   . Alcohol abuse Brother   . Hypertension Brother     ROS: Review of Systems  Constitutional: Positive for weight loss.  Gastrointestinal: Negative for nausea and vomiting.  Musculoskeletal:       Negative for muscle weakness  Endo/Heme/Allergies:       Negative for hypoglycemia    PHYSICAL EXAM: Pt in no acute distress  RECENT LABS AND TESTS: BMET    Component Value Date/Time   NA 138 09/13/2018 0821   K 4.1 09/13/2018 0821   CL 104 09/13/2018 0821   CO2 25 09/13/2018 0821   GLUCOSE 179 (H) 09/13/2018 0821   BUN 32 (H) 09/13/2018 0821   CREATININE 1.43 09/13/2018 0821   CREATININE 1.18 09/28/2014 1221   CALCIUM 9.3 09/13/2018 0821   GFRNONAA 86.03 02/23/2010 1600   GFRAA  10/04/2008 2130    >60        The eGFR has been calculated using the MDRD equation. This calculation has not been validated in all clinical situations. eGFR's persistently <60 mL/min signify possible Chronic Kidney Disease.   Lab Results  Component Value Date   HGBA1C 8.8 (A) 11/13/2018   HGBA1C 7.5 (A) 08/07/2018   HGBA1C 8.9 (A) 06/05/2018   HGBA1C 11.3 (A) 04/04/2018    HGBA1C 9.6 (H) 06/09/2017   No results found for: INSULIN CBC    Component Value Date/Time   WBC 9.1 09/13/2018 0821   RBC 5.78 09/13/2018 0821   HGB 16.5 09/13/2018 0821   HCT 49.5 09/13/2018 0821   PLT 248.0 09/13/2018 0821   MCV 85.6 09/13/2018 0821   MCV 83.0 09/28/2014 1225   MCH 27.2 09/28/2014 1225   MCHC 33.4 09/13/2018 0821   RDW 14.0 09/13/2018 0821   LYMPHSABS 1.8 09/13/2018 0821   MONOABS 0.9 09/13/2018 0821   EOSABS 0.1 09/13/2018 0821   BASOSABS 0.1 09/13/2018 0821   Iron/TIBC/Ferritin/ %Sat No results found for: IRON, TIBC, FERRITIN, IRONPCTSAT Lipid Panel     Component Value Date/Time   CHOL 178 09/13/2018 0821   TRIG (H) 09/13/2018 0821    527.0 Triglyceride is over 400; calculations on Lipids are invalid.   HDL 32.20 (L) 09/13/2018 0821   CHOLHDL 6 09/13/2018 0821   VLDL 44.0 (H) 05/30/2016 0929   LDLDIRECT 96.0 09/13/2018 0821   Hepatic Function Panel     Component Value Date/Time   PROT 6.6 09/13/2018 0821   ALBUMIN 4.2 09/13/2018 0821   AST 16 09/13/2018 0821   ALT 16 09/13/2018 0821   ALKPHOS 75 09/13/2018 0821   BILITOT 0.5 09/13/2018 0821   BILIDIR 0.1 09/13/2018 0821      Component Value Date/Time   TSH 3.02 09/13/2018 0821   TSH 2.78 06/09/2017 1040   TSH 2.40 05/30/2016 0929     Ref. Range 11/07/2018 09:10  Vitamin D, 25-Hydroxy Latest Ref Range: 30.0 - 100.0 ng/mL 6.3 (L)    I, Doreene Nest, am acting as Location manager for [employer]. Owens Shark, DO  I have reviewed the above documentation for accuracy and completeness, and I agree with the above. -Jearld Lesch, DO

## 2019-01-15 ENCOUNTER — Ambulatory Visit (INDEPENDENT_AMBULATORY_CARE_PROVIDER_SITE_OTHER): Payer: PPO | Admitting: Endocrinology

## 2019-01-15 ENCOUNTER — Encounter (INDEPENDENT_AMBULATORY_CARE_PROVIDER_SITE_OTHER): Payer: Self-pay

## 2019-01-15 ENCOUNTER — Other Ambulatory Visit: Payer: Self-pay

## 2019-01-15 ENCOUNTER — Other Ambulatory Visit: Payer: Self-pay | Admitting: Endocrinology

## 2019-01-15 DIAGNOSIS — N183 Chronic kidney disease, stage 3 (moderate): Secondary | ICD-10-CM | POA: Diagnosis not present

## 2019-01-15 DIAGNOSIS — E1122 Type 2 diabetes mellitus with diabetic chronic kidney disease: Secondary | ICD-10-CM

## 2019-01-15 DIAGNOSIS — Z794 Long term (current) use of insulin: Secondary | ICD-10-CM

## 2019-01-15 MED ORDER — INSULIN GLARGINE 100 UNIT/ML SOLOSTAR PEN: 50.0000 [IU] | PEN_INJECTOR | Freq: Every day | SUBCUTANEOUS | 99 refills | Status: AC

## 2019-01-15 NOTE — Patient Instructions (Addendum)
check your blood sugar twice a day.  vary the time of day when you check, between before the 3 meals, and at bedtime.  also check if you have symptoms of your blood sugar being too high or too low.  please keep a record of the readings and bring it to your next appointment here (or you can bring the meter itself).  You can write it on any piece of paper.  please call us sooner if your blood sugar goes below 70, or if you have a lot of readings over 200. Please reduce the insulin to 50 units each morning, and:  Please continue the same medications.  Please come back for a follow-up appointment in 2 months.

## 2019-01-15 NOTE — Progress Notes (Addendum)
Subjective:    Patient ID: Bernard Roberts, male    DOB: March 11, 1951, 68 y.o.   MRN: [id number]  HPI  telehealth visit today via doxy video visit.  Alternatives to telehealth are presented to this patient, and the patient agrees to the telehealth visit. Pt is advised of the cost of the visit, and agrees to this, also.   Patient is at home, and I am at the office.   Pt returns for f/u of diabetes mellitus:  DM type: Insulin-requiring type 2 Dx'ed: 1998 Complications: polyneuropathy and renal insuff.   Therapy: insulin, trulicity, and 2 oral meds.  DKA: never Severe hypoglycemia: never Pancreatitis: never Pancreatic imaging: never Other: he also took insulin approx 2014-2017; he declines multiple daily injections.   Interval history: pt states he feels well in general.  He says cbg's vary from 105-220.  He seldom has hypoglycemia, and these are mild.  He has started weight loss program. He has lost 5 more lbs.  He often takes less the rx'ed amount of insulin, to avoid hypoglycemia Past Medical History:  Diagnosis Date  . Anxiety   . Arthritis of right knee   . Back pain   . Depression   . Diabetes mellitus without complication (HCC)   . Elevated PSA    Around 15   . Enlarged prostate   . Gout   . Hyperlipidemia   . Hypertension   . Joint pain   . Shortness of breath     Past Surgical History:  Procedure Laterality Date  . KNEE ARTHROPLASTY     unknown which side    Social History   Socioeconomic History  . Marital status: Married    Spouse name: Cristino Martes  . Number of children: Not on file  . Years of education: Not on file  . Highest education level: Not on file  Occupational History  . Occupation: [employer], Clinical research associate  Social Needs  . Financial resource strain: Not on file  . Food insecurity:    Worry: Not on file    Inability: Not on file  . Transportation needs:    Medical: Not on file    Non-medical: Not on file  Tobacco Use  . Smoking status: Never Smoker   . Smokeless tobacco: Never Used  . Tobacco comment: smoked over 100 cigerettes per lifetime  Substance and Sexual Activity  . Alcohol use: Yes    Alcohol/week: 0.0 standard drinks    Comment: not a drinker 3 to 4 a week   . Drug use: No  . Sexual activity: Not on file  Lifestyle  . Physical activity:    Days per week: Not on file    Minutes per session: Not on file  . Stress: Not on file  Relationships  . Social connections:    Talks on phone: Not on file    Gets together: Not on file    Attends religious service: Not on file    Active member of club or organization: Not on file    Attends meetings of clubs or organizations: Not on file    Relationship status: Not on file  . Intimate partner violence:    Fear of current or ex partner: Not on file    Emotionally abused: Not on file    Physically abused: Not on file    Forced sexual activity: Not on file  Other Topics Concern  . Not on file  Social History Narrative   Works for as  [employer]  He likes to swim, write, hike.     Current Outpatient Medications on File Prior to Visit  Medication Sig Dispense Refill  . aspirin EC 81 MG tablet Take 81 mg by mouth daily.    . Dulaglutide (TRULICITY) 1.5 MG/0.5ML SOPN INJECT 1.5MG  INTO THE SKIN ONCE A WEEK 1 pen 10  . fenofibrate (TRICOR) 145 MG tablet Take 1 tablet (145 mg total) by mouth daily. 90 tablet 3  . finasteride (PROSCAR) 5 MG tablet Take 5 mg by mouth daily.    Marland Kitchen. ibuprofen (ADVIL,MOTRIN) 600 MG tablet Take 1 tablet (600 mg total) by mouth every 6 (six) hours as needed. 30 tablet 0  . INVOKANA 300 MG TABS tablet TAKE ONE TABLET BY MOUTH EVERY MORNING BEFORE BREAKFAST 30 tablet 0  . Lancets (FREESTYLE) lancets USE TO TEST BLOOD GLUCOSE ONCE DAILY 100 each 3  . losartan (COZAAR) 100 MG tablet Take 1 tablet (100 mg total) by mouth daily. 90 tablet 3  . metFORMIN (GLUCOPHAGE-XR) 500 MG 24 hr tablet TAKE TWO TABLETS BY MOUTH EVERY MORNING AND TAKE THREE TABLETS BY MOUTH EVERY  EVENING 120 tablet 0  . sildenafil (VIAGRA) 50 MG tablet Take 1-2 tablets as needed 30 tablet 6  . tamsulosin (FLOMAX) 0.4 MG CAPS capsule TAKE ONE CAPSULE BY MOUTH DAILY 90 capsule 1  . testosterone cypionate (DEPOTESTOSTERONE CYPIONATE) 200 MG/ML injection Inject 200 mg into the muscle every 14 (fourteen) days.    . Vitamin D, Ergocalciferol, (DRISDOL) 1.25 MG (50000 UT) CAPS capsule Take 1 capsule (50,000 Units total) by mouth every 7 (seven) days. 4 capsule 0  . zolpidem (AMBIEN) 5 MG tablet TAKE ONE TABLET BY MOUTH EVERY NIGHT AT BEDTIME 30 tablet 1   No current facility-administered medications on file prior to visit.     Allergies  Allergen Reactions  . Victoza [Liraglutide] Nausea And Vomiting    Family History  Problem Relation Age of Onset  . Depression Mother   . Anxiety disorder Mother   . Obesity Mother   . Diabetes Father   . Heart disease Father   . Hypertension Father   . Stroke Father   . High Cholesterol Father   . Depression Father   . Obesity Father   . Diabetes Sister   . Mental illness Sister        Parnoid   . Alcohol abuse Brother   . Hypertension Brother      Review of Systems Denies LOC    Objective:   Physical Exam   Lab Results  Component Value Date   CREATININE 1.43 09/13/2018   BUN 32 (H) 09/13/2018   NA 138 09/13/2018   K 4.1 09/13/2018   CL 104 09/13/2018   CO2 25 09/13/2018       Assessment & Plan:  Insulin-requiring type 2 DM, with DM: overcontrolled.  We discussed.  He declines to check a1c today Hypoglycemia: This limits aggressiveness of glycemic control.   Renal insuff: in this context, he may need a faster-acting qd insulin, but we'll continue the same for now.    Patient Instructions  check your blood sugar twice a day.  vary the time of day when you check, between before the 3 meals, and at bedtime.  also check if you have symptoms of your blood sugar being too high or too low.  please keep a record of the readings  and bring it to your next appointment here (or you can bring the meter itself).  You can write  it on any piece of paper.  please call us sooner if your blood sugar goes below 70, or if you have a lot of readings over 200. Please reduce the insulin to 50 units each morning, and:  Please continue the same medications.  Please come back for a follow-up appointment in 2 months.

## 2019-01-23 ENCOUNTER — Ambulatory Visit: Payer: Self-pay

## 2019-01-23 NOTE — Telephone Encounter (Signed)
Pt called stating that he has just had a tele visit with a doctor via his insurance. He states he has cold symptoms with productive cough no fever chest discomfort typical of when he has a cold. He state that the doctor recommended quarantine for 14 days.  He wanted to be tested. He was advised at this time testing was not done unless sick enough to enter hospital for care. He states he just wanted to understand quarantine better. Pt was read quarantine requirements for no know exposure. He was advised to create sick room and stay away form his wife. Cover cough and sneeze. Clean all touch surfaces. He was reminded that he needs to call back for SOB, Chest pain and development of fever. He was read the 3 requirements to stop isolation. No fever 72 hours off fever medications. Cough and other symptoms improved. Symptoms start more that 7 days ago. Pt verbalized understanding of all instructions  Answer Assessment - Initial Assessment Questions 1. REASON FOR CALL or QUESTION: "What is your reason for calling today?" or "How can I best help you?" or "What question do you have that I can help answer?"     Pt states that he just completed a tele visit through his insurance provider.He was recommended to quarentine for 14 days. He wanted to be tested and  wanted more instruction on Quarantine  Protocols used: INFORMATION ONLY CALL-A-AH

## 2019-01-24 ENCOUNTER — Other Ambulatory Visit: Payer: Self-pay

## 2019-01-24 ENCOUNTER — Encounter (INDEPENDENT_AMBULATORY_CARE_PROVIDER_SITE_OTHER): Payer: Self-pay | Admitting: Bariatrics

## 2019-01-24 ENCOUNTER — Ambulatory Visit (INDEPENDENT_AMBULATORY_CARE_PROVIDER_SITE_OTHER): Payer: PPO | Admitting: Bariatrics

## 2019-01-24 DIAGNOSIS — E119 Type 2 diabetes mellitus without complications: Secondary | ICD-10-CM

## 2019-01-24 DIAGNOSIS — E781 Pure hyperglyceridemia: Secondary | ICD-10-CM

## 2019-01-24 DIAGNOSIS — E559 Vitamin D deficiency, unspecified: Secondary | ICD-10-CM

## 2019-01-24 DIAGNOSIS — Z6830 Body mass index (BMI) 30.0-30.9, adult: Secondary | ICD-10-CM

## 2019-01-24 DIAGNOSIS — Z794 Long term (current) use of insulin: Secondary | ICD-10-CM | POA: Diagnosis not present

## 2019-01-24 DIAGNOSIS — E669 Obesity, unspecified: Secondary | ICD-10-CM

## 2019-01-28 NOTE — Progress Notes (Signed)
Office: [phone]  /  Fax: [phone] TeleHealth Visit:  Bernard Roberts has verbally consented to this TeleHealth visit today. The patient is located at home, the provider is located at the [facility] office. The participants in this visit include the listed provider and patient and any and all parties involved. The visit was conducted today via WebEx.  HPI:   Chief Complaint: OBESITY Bernard Roberts is here to discuss his progress with his obesity treatment plan. He is on the Category 3 plan and is following his eating plan approximately 50+ % of the time. He states he is walking 3 miles 1 to 2 times per week and he is walking for 15 to 20 minutes 7 times per week. Bernard Roberts is unsure if he has lost weight. His weight at home was 195 pounds. We were unable to weigh the patient today for this TeleHealth visit. He feels as if he has maintained weight since his last visit. He has lost 6 lbs since starting treatment with [place].  Diabetes II Bernard Roberts has a diagnosis of diabetes type II. He saw the endocrinologist 01/15/19. Bernard Roberts states BGs range between 150 and 170 in the morning and 135 in the evening. He is taking Lantus 50 units and Dulaglutide, Invokana and Glucophage. Bernard Roberts denies any hypoglycemic episodes. Last A1c was at 8.8 He has been working on intensive lifestyle modifications including diet, exercise, and weight loss to help control his blood glucose levels.  Vitamin D deficiency Bernard Roberts has a diagnosis of vitamin D deficiency. His last vitamin D level was at 6.3. Bernard Roberts is taking high dose vit D and he denies nausea, vomiting or muscle weakness.  Hypertriglyceridemia Bernard Roberts has hypertriglyceridemia and he is taking Tricor. He has been trying to improve his triglyceride levels with intensive lifestyle modification including a low saturated fat diet, exercise and weight loss. He denies any chest pain or myalgias.  ASSESSMENT AND PLAN:  Vitamin D deficiency  Type 2 diabetes  mellitus without complication, with long-term current use of insulin (HCC)  Hypertriglyceridemia  Class 1 obesity with serious comorbidity and body mass index (BMI) of 30.0 to 30.9 in adult, unspecified obesity type  PLAN:  Diabetes II Bernard Roberts has been given extensive diabetes education by myself today including ideal fasting and post-prandial blood glucose readings, individual ideal Hgb A1c goals and hypoglycemia prevention. We discussed the importance of good blood sugar control to decrease the likelihood of diabetic complications such as nephropathy, neuropathy, limb loss, blindness, coronary artery disease, and death. We discussed the importance of intensive lifestyle modification including diet, exercise and weight loss as the first line treatment for diabetes. Bernard Roberts agrees to continue his diabetes medications and will follow up at the agreed upon time.  Vitamin D Deficiency Bernard Roberts was informed that low vitamin D levels contributes to fatigue and are associated with obesity, breast, and colon cancer. He agrees to continue to take prescription Vit D _0 ,000 IU every week and will follow up for routine testing of vitamin D, at least 2-3 times per year. He was informed of the risk of over-replacement of vitamin D and agrees to not increase his dose unless he discusses this with [place] first.  Hypertriglyceridemia Bernard Roberts was informed of the American Heart Association Guidelines emphasizing intensive lifestyle modifications as the first line treatment for hypertriglyceridemia. We discussed many lifestyle modifications today in depth, and Bernard Roberts will continue to work on decreasing saturated fats such as fatty red meat, butter and many fried foods. He will also increase MUFA's,  PUFA's, vegetables and lean protein in his diet and continue to work on exercise and weight loss efforts.  Obesity Bernard Roberts is currently in the action stage of change. As such, his goal is to continue with weight loss efforts He  has agreed to follow the Category 3 plan Bernard Roberts will continue activity for weight loss and overall health benefits. We discussed the following Behavioral Modification Strategies today: planning for success, increase H2O intake, no skipping meals, keeping healthy foods in the home, increasing lean protein intake, decreasing simple carbohydrates, increasing vegetables, decrease eating out and work on meal planning and easy cooking plans Bernard Roberts will weigh himself at home before each visit.  Bernard Roberts has agreed to follow up with our clinic in 2 weeks. He was informed of the importance of frequent follow up visits to maximize his success with intensive lifestyle modifications for his multiple health conditions.  ALLERGIES: Allergies  Allergen Reactions  . Victoza [Liraglutide] Nausea And Vomiting    MEDICATIONS: Current Outpatient Medications on File Prior to Visit  Medication Sig Dispense Refill  . aspirin EC 81 MG tablet Take 81 mg by mouth daily.    . Dulaglutide (TRULICITY) 1.5 PQ/3.3AQ SOPN INJECT 1.5MG INTO THE SKIN ONCE A WEEK 1 pen 10  . fenofibrate (TRICOR) 145 MG tablet Take 1 tablet (145 mg total) by mouth daily. 90 tablet 3  . finasteride (PROSCAR) 5 MG tablet Take 5 mg by mouth daily.    Marland Kitchen FREESTYLE TEST STRIPS test strip USE TO TEST BLOOD SUGAR TWO TIMES A DAY 100 each 0  . ibuprofen (ADVIL,MOTRIN) 600 MG tablet Take 1 tablet (600 mg total) by mouth every 6 (six) hours as needed. 30 tablet 0  . Insulin Glargine (LANTUS SOLOSTAR) 100 UNIT/ML Solostar Pen Inject 50 Units into the skin daily. And pen needles 1/day 15 pen PRN  . INVOKANA 300 MG TABS tablet TAKE ONE TABLET BY MOUTH EVERY MORNING BEFORE BREAKFAST 30 tablet 0  . Lancets (FREESTYLE) lancets USE TO TEST BLOOD GLUCOSE ONCE DAILY 100 each 3  . losartan (COZAAR) 100 MG tablet Take 1 tablet (100 mg total) by mouth daily. 90 tablet 3  . metFORMIN (GLUCOPHAGE-XR) 500 MG 24 hr tablet TAKE TWO TABLETS BY MOUTH EVERY MORNING AND TAKE  THREE TABLETS BY MOUTH EVERY EVENING 120 tablet 0  . sildenafil (VIAGRA) 50 MG tablet Take 1-2 tablets as needed 30 tablet 6  . tamsulosin (FLOMAX) 0.4 MG CAPS capsule TAKE ONE CAPSULE BY MOUTH DAILY 90 capsule 1  . testosterone cypionate (DEPOTESTOSTERONE CYPIONATE) 200 MG/ML injection Inject 200 mg into the muscle every 14 (fourteen) days.    . Vitamin D, Ergocalciferol, (DRISDOL) 1.25 MG (50000 UT) CAPS capsule Take 1 capsule (50,000 Units total) by mouth every 7 (seven) days. 4 capsule 0  . zolpidem (AMBIEN) 5 MG tablet TAKE ONE TABLET BY MOUTH EVERY NIGHT AT BEDTIME 30 tablet 1   No current facility-administered medications on file prior to visit.     PAST MEDICAL HISTORY: Past Medical History:  Diagnosis Date  . Anxiety   . Arthritis of right knee   . Back pain   . Depression   . Diabetes mellitus without complication ([place])   . Elevated PSA    Around 15   . Enlarged prostate   . Gout   . Hyperlipidemia   . Hypertension   . Joint pain   . Shortness of breath     PAST SURGICAL HISTORY: Past Surgical History:  Procedure Laterality Date  .  KNEE ARTHROPLASTY     unknown which side    SOCIAL HISTORY: Social History   Tobacco Use  . Smoking status: Never Smoker  . Smokeless tobacco: Never Used  . Tobacco comment: smoked over 100 cigerettes per lifetime  Substance Use Topics  . Alcohol use: Yes    Alcohol/week: 0.0 standard drinks    Comment: not a drinker 3 to 4 a week   . Drug use: No    FAMILY HISTORY: Family History  Problem Relation Age of Onset  . Depression Mother   . Anxiety disorder Mother   . Obesity Mother   . Diabetes Father   . Heart disease Father   . Hypertension Father   . Stroke Father   . High Cholesterol Father   . Depression Father   . Obesity Father   . Diabetes Sister   . Mental illness Sister        Parnoid   . Alcohol abuse Brother   . Hypertension Brother     ROS: Review of Systems  Constitutional: Negative for weight loss.   Cardiovascular: Negative for chest pain.  Gastrointestinal: Negative for nausea and vomiting.  Musculoskeletal: Negative for myalgias.       Negative for muscle weakness  Endo/Heme/Allergies:       Negative for hypoglycemia    PHYSICAL EXAM: Pt in no acute distress  RECENT LABS AND TESTS: BMET    Component Value Date/Time   NA 138 09/13/2018 0821   K 4.1 09/13/2018 0821   CL 104 09/13/2018 0821   CO2 25 09/13/2018 0821   GLUCOSE 179 (H) 09/13/2018 0821   BUN 32 (H) 09/13/2018 0821   CREATININE 1.43 09/13/2018 0821   CREATININE 1.18 09/28/2014 1221   CALCIUM 9.3 09/13/2018 0821   GFRNONAA 86.03 02/23/2010 1600   GFRAA  10/04/2008 2130    >60        The eGFR has been calculated using the MDRD equation. This calculation has not been validated in all clinical situations. eGFR's persistently <60 mL/min signify possible Chronic Kidney Disease.   Lab Results  Component Value Date   HGBA1C 8.8 (A) 11/13/2018   HGBA1C 7.5 (A) 08/07/2018   HGBA1C 8.9 (A) 06/05/2018   HGBA1C 11.3 (A) 04/04/2018   HGBA1C 9.6 (H) 06/09/2017   No results found for: INSULIN CBC    Component Value Date/Time   WBC 9.1 09/13/2018 0821   RBC 5.78 09/13/2018 0821   HGB 16.5 09/13/2018 0821   HCT 49.5 09/13/2018 0821   PLT 248.0 09/13/2018 0821   MCV 85.6 09/13/2018 0821   MCV 83.0 09/28/2014 1225   MCH 27.2 09/28/2014 1225   MCHC 33.4 09/13/2018 0821   RDW 14.0 09/13/2018 0821   LYMPHSABS 1.8 09/13/2018 0821   MONOABS 0.9 09/13/2018 0821   EOSABS 0.1 09/13/2018 0821   BASOSABS 0.1 09/13/2018 0821   Iron/TIBC/Ferritin/ %Sat No results found for: IRON, TIBC, FERRITIN, IRONPCTSAT Lipid Panel     Component Value Date/Time   CHOL 178 09/13/2018 0821   TRIG (H) 09/13/2018 0821    527.0 Triglyceride is over 400; calculations on Lipids are invalid.   HDL 32.20 (L) 09/13/2018 0821   CHOLHDL 6 09/13/2018 0821   VLDL 44.0 (H) 05/30/2016 0929   LDLDIRECT 96.0 09/13/2018 0821   Hepatic  Function Panel     Component Value Date/Time   PROT 6.6 09/13/2018 0821   ALBUMIN 4.2 09/13/2018 0821   AST 16 09/13/2018 0821   ALT 16 09/13/2018 0821  ALKPHOS 75 09/13/2018 0821   BILITOT 0.5 09/13/2018 0821   BILIDIR 0.1 09/13/2018 0821      Component Value Date/Time   TSH 3.02 09/13/2018 0821   TSH 2.78 06/09/2017 1040   TSH 2.40 05/30/2016 0929     Ref. Range 11/07/2018 09:10  Vitamin D, 25-Hydroxy Latest Ref Range: 30.0 - 100.0 ng/mL 6.3 (L)    I, Doreene Nest, am acting as Location manager for [employer]. Owens Shark, DO   I have reviewed the above documentation for accuracy and completeness, and I agree with the above. -Jearld Lesch, DO

## 2019-02-05 ENCOUNTER — Other Ambulatory Visit: Payer: Self-pay | Admitting: Adult Health

## 2019-02-06 NOTE — Telephone Encounter (Signed)
Sent to the pharmacy by e-scribe. 

## 2019-02-07 ENCOUNTER — Encounter (INDEPENDENT_AMBULATORY_CARE_PROVIDER_SITE_OTHER): Payer: Self-pay | Admitting: Bariatrics

## 2019-02-07 ENCOUNTER — Ambulatory Visit (INDEPENDENT_AMBULATORY_CARE_PROVIDER_SITE_OTHER): Payer: PPO | Admitting: Bariatrics

## 2019-02-11 ENCOUNTER — Other Ambulatory Visit: Payer: Self-pay

## 2019-02-11 ENCOUNTER — Ambulatory Visit (INDEPENDENT_AMBULATORY_CARE_PROVIDER_SITE_OTHER): Payer: PPO | Admitting: Family Medicine

## 2019-02-11 ENCOUNTER — Encounter (INDEPENDENT_AMBULATORY_CARE_PROVIDER_SITE_OTHER): Payer: Self-pay | Admitting: Family Medicine

## 2019-02-11 DIAGNOSIS — E1165 Type 2 diabetes mellitus with hyperglycemia: Secondary | ICD-10-CM | POA: Diagnosis not present

## 2019-02-11 DIAGNOSIS — Z794 Long term (current) use of insulin: Secondary | ICD-10-CM | POA: Diagnosis not present

## 2019-02-11 DIAGNOSIS — Z6830 Body mass index (BMI) 30.0-30.9, adult: Secondary | ICD-10-CM | POA: Diagnosis not present

## 2019-02-11 DIAGNOSIS — E559 Vitamin D deficiency, unspecified: Secondary | ICD-10-CM

## 2019-02-11 DIAGNOSIS — E669 Obesity, unspecified: Secondary | ICD-10-CM

## 2019-02-11 MED ORDER — VITAMIN D (ERGOCALCIFEROL) 1.25 MG (50000 UNIT) PO CAPS: 50000.0000 [IU] | ORAL_CAPSULE | ORAL | 0 refills | Status: AC

## 2019-02-12 NOTE — Progress Notes (Signed)
Office: [phone]  /  Fax: [phone] TeleHealth Visit:  Bernard Roberts has verbally consented to this TeleHealth visit today. The patient is located at home, the provider is located at the [employer] [facility] office. The participants in this visit include the listed provider and patient. The visit was conducted today via webex.  HPI:   Chief Complaint: OBESITY Bernard Roberts is here to discuss his progress with his obesity treatment plan. He is on the Category 3 plan and is following his eating plan approximately 60 % of the time. He states he is walking for 30-40 minutes 5 times per week. Bernard Roberts weighed in at 194 lbs today. He notes the meal plan has been somewhat lax, just trying to stay within Category 12/1498 calories. He has been doing a good amount of walking the past few weeks.  We were unable to weigh the patient today for this TeleHealth visit. He feels as if he has maintained his weight since his last visit. He has lost 2 lbs since starting treatment with [place].  Vitamin D Deficiency Bernard Roberts has a diagnosis of vitamin D deficiency. He is currently taking prescription Vit D. He notes fatigue and denies nausea, vomiting or muscle weakness.  Diabetes II with Hyperglycemia Bernard Roberts has a diagnosis of diabetes type II. Bernard Roberts states fasting BGs range around 160 and below. He denies hypoglycemia. He has decreased insulin to 50 units, and he is on Dulaglutide, Invokana, and metfomin. Last A1c was 8.8. He has been working on intensive lifestyle modifications including diet, exercise, and weight loss to help control his blood glucose levels.  ASSESSMENT AND PLAN:  Vitamin D deficiency - Plan: Vitamin D, Ergocalciferol, (DRISDOL) 1.25 MG (50000 UT) CAPS capsule  Type 2 diabetes mellitus with hyperglycemia, with long-term current use of insulin (HCC)  Class 1 obesity with serious comorbidity and body mass index (BMI) of 30.0 to 30.9 in adult, unspecified obesity type  PLAN:  Vitamin D  Deficiency Bernard Roberts was informed that low vitamin D levels contributes to fatigue and are associated with obesity, breast, and colon cancer. Bernard Roberts agrees to continue taking prescription Vit D '@50' ,000 IU every week #4 and we will refill for 1 month. He will follow up for routine testing of vitamin D, at least 2-3 times per year. He was informed of the risk of over-replacement of vitamin D and agrees to not increase his dose unless he discusses this with [place] first. Bernard Roberts agrees to follow up with our clinic in 2 weeks.  Diabetes II with Hyperglycemia Bernard Roberts has been given extensive diabetes education by myself today including ideal fasting and post-prandial blood glucose readings, individual ideal Hgb A1c goals and hypoglycemia prevention. We discussed the importance of good blood sugar control to decrease the likelihood of diabetic complications such as nephropathy, neuropathy, limb loss, blindness, coronary artery disease, and death. We discussed the importance of intensive lifestyle modification including diet, exercise and weight loss as the first line treatment for diabetes. Bernard Roberts agrees to continue his current diabetes medications, and he agrees to follow up with our clinic in 2 weeks.  Obesity Bernard Roberts is currently in the action stage of change. As such, his goal is to continue with weight loss efforts He has agreed to follow the Category 3 plan Bernard Roberts has been instructed to work up to a goal of 150 minutes of combined cardio and strengthening exercise per week for weight loss and overall health benefits. We discussed the following Behavioral Modification Strategies today: increasing lean protein intake, increasing  vegetables and work on meal planning and easy cooking plans, keeping healthy foods in the home, better snacking choices, and planning for success   Bernard Roberts has agreed to follow up with our clinic in 2 weeks. He was informed of the importance of frequent follow up visits to maximize his  success with intensive lifestyle modifications for his multiple health conditions.  ALLERGIES: Allergies  Allergen Reactions  . Victoza [Liraglutide] Nausea And Vomiting    MEDICATIONS: Current Outpatient Medications on File Prior to Visit  Medication Sig Dispense Refill  . aspirin EC 81 MG tablet Take 81 mg by mouth daily.    . Dulaglutide (TRULICITY) 1.5 NL/9.7QB SOPN INJECT 1.5MG INTO THE SKIN ONCE A WEEK 1 pen 10  . fenofibrate (TRICOR) 145 MG tablet Take 1 tablet (145 mg total) by mouth daily. 90 tablet 3  . finasteride (PROSCAR) 5 MG tablet Take 5 mg by mouth daily.    Marland Kitchen FREESTYLE TEST STRIPS test strip USE TO TEST BLOOD SUGAR TWO TIMES A DAY 100 each 0  . ibuprofen (ADVIL,MOTRIN) 600 MG tablet Take 1 tablet (600 mg total) by mouth every 6 (six) hours as needed. 30 tablet 0  . Insulin Glargine (LANTUS SOLOSTAR) 100 UNIT/ML Solostar Pen Inject 50 Units into the skin daily. And pen needles 1/day 15 pen PRN  . INVOKANA 300 MG TABS tablet TAKE ONE TABLET BY MOUTH EVERY MORNING BEFORE BREAKFAST 30 tablet 0  . Lancets (FREESTYLE) lancets USE TO TEST BLOOD GLUCOSE ONCE DAILY 100 each 3  . losartan (COZAAR) 100 MG tablet TAKE ONE TABLET BY MOUTH DAILY 90 tablet 1  . metFORMIN (GLUCOPHAGE-XR) 500 MG 24 hr tablet TAKE TWO TABLETS BY MOUTH EVERY MORNING AND TAKE THREE TABLETS BY MOUTH EVERY EVENING 120 tablet 0  . sildenafil (VIAGRA) 50 MG tablet Take 1-2 tablets as needed 30 tablet 6  . tamsulosin (FLOMAX) 0.4 MG CAPS capsule TAKE ONE CAPSULE BY MOUTH DAILY 90 capsule 1  . testosterone cypionate (DEPOTESTOSTERONE CYPIONATE) 200 MG/ML injection Inject 200 mg into the muscle every 14 (fourteen) days.    Marland Kitchen zolpidem (AMBIEN) 5 MG tablet TAKE ONE TABLET BY MOUTH EVERY NIGHT AT BEDTIME 30 tablet 1   No current facility-administered medications on file prior to visit.     PAST MEDICAL HISTORY: Past Medical History:  Diagnosis Date  . Anxiety   . Arthritis of right knee   . Back pain   .  Depression   . Diabetes mellitus without complication ([place])   . Elevated PSA    Around 15   . Enlarged prostate   . Gout   . Hyperlipidemia   . Hypertension   . Joint pain   . Shortness of breath     PAST SURGICAL HISTORY: Past Surgical History:  Procedure Laterality Date  . KNEE ARTHROPLASTY     unknown which side    SOCIAL HISTORY: Social History   Tobacco Use  . Smoking status: Never Smoker  . Smokeless tobacco: Never Used  . Tobacco comment: smoked over 100 cigerettes per lifetime  Substance Use Topics  . Alcohol use: Yes    Alcohol/week: 0.0 standard drinks    Comment: not a drinker 3 to 4 a week   . Drug use: No    FAMILY HISTORY: Family History  Problem Relation Age of Onset  . Depression Mother   . Anxiety disorder Mother   . Obesity Mother   . Diabetes Father   . Heart disease Father   . Hypertension  Father   . Stroke Father   . High Cholesterol Father   . Depression Father   . Obesity Father   . Diabetes Sister   . Mental illness Sister        Parnoid   . Alcohol abuse Brother   . Hypertension Brother     ROS: Review of Systems  Constitutional: Positive for malaise/fatigue. Negative for weight loss.  Gastrointestinal: Negative for nausea and vomiting.  Musculoskeletal:       Negative muscle weakness  Endo/Heme/Allergies:       Negative hypoglycemia    PHYSICAL EXAM: Pt in no acute distress  RECENT LABS AND TESTS: BMET    Component Value Date/Time   NA 138 09/13/2018 0821   K 4.1 09/13/2018 0821   CL 104 09/13/2018 0821   CO2 25 09/13/2018 0821   GLUCOSE 179 (H) 09/13/2018 0821   BUN 32 (H) 09/13/2018 0821   CREATININE 1.43 09/13/2018 0821   CREATININE 1.18 09/28/2014 1221   CALCIUM 9.3 09/13/2018 0821   GFRNONAA 86.03 02/23/2010 1600   GFRAA  10/04/2008 2130    >60        The eGFR has been calculated using the MDRD equation. This calculation has not been validated in all clinical situations. eGFR's persistently <60  mL/min signify possible Chronic Kidney Disease.   Lab Results  Component Value Date   HGBA1C 8.8 (A) 11/13/2018   HGBA1C 7.5 (A) 08/07/2018   HGBA1C 8.9 (A) 06/05/2018   HGBA1C 11.3 (A) 04/04/2018   HGBA1C 9.6 (H) 06/09/2017   No results found for: INSULIN CBC    Component Value Date/Time   WBC 9.1 09/13/2018 0821   RBC 5.78 09/13/2018 0821   HGB 16.5 09/13/2018 0821   HCT 49.5 09/13/2018 0821   PLT 248.0 09/13/2018 0821   MCV 85.6 09/13/2018 0821   MCV 83.0 09/28/2014 1225   MCH 27.2 09/28/2014 1225   MCHC 33.4 09/13/2018 0821   RDW 14.0 09/13/2018 0821   LYMPHSABS 1.8 09/13/2018 0821   MONOABS 0.9 09/13/2018 0821   EOSABS 0.1 09/13/2018 0821   BASOSABS 0.1 09/13/2018 0821   Iron/TIBC/Ferritin/ %Sat No results found for: IRON, TIBC, FERRITIN, IRONPCTSAT Lipid Panel     Component Value Date/Time   CHOL 178 09/13/2018 0821   TRIG (H) 09/13/2018 0821    527.0 Triglyceride is over 400; calculations on Lipids are invalid.   HDL 32.20 (L) 09/13/2018 0821   CHOLHDL 6 09/13/2018 0821   VLDL 44.0 (H) 05/30/2016 0929   LDLDIRECT 96.0 09/13/2018 0821   Hepatic Function Panel     Component Value Date/Time   PROT 6.6 09/13/2018 0821   ALBUMIN 4.2 09/13/2018 0821   AST 16 09/13/2018 0821   ALT 16 09/13/2018 0821   ALKPHOS 75 09/13/2018 0821   BILITOT 0.5 09/13/2018 0821   BILIDIR 0.1 09/13/2018 0821      Component Value Date/Time   TSH 3.02 09/13/2018 0821   TSH 2.78 06/09/2017 1040   TSH 2.40 05/30/2016 0929      I, Trixie Dredge, am acting as transcriptionist for Ilene Qua, MD  I have reviewed the above documentation for accuracy and completeness, and I agree with the above. - Ilene Qua, MD

## 2019-02-14 ENCOUNTER — Other Ambulatory Visit: Payer: Self-pay | Admitting: Endocrinology

## 2019-02-22 ENCOUNTER — Other Ambulatory Visit: Payer: Self-pay | Admitting: Adult Health

## 2019-02-26 ENCOUNTER — Ambulatory Visit (INDEPENDENT_AMBULATORY_CARE_PROVIDER_SITE_OTHER): Payer: PPO | Admitting: Bariatrics

## 2019-02-28 ENCOUNTER — Encounter (INDEPENDENT_AMBULATORY_CARE_PROVIDER_SITE_OTHER): Payer: Self-pay

## 2019-03-12 ENCOUNTER — Other Ambulatory Visit: Payer: Self-pay

## 2019-03-14 ENCOUNTER — Other Ambulatory Visit: Payer: Self-pay

## 2019-03-14 ENCOUNTER — Encounter: Payer: Self-pay | Admitting: Endocrinology

## 2019-03-14 ENCOUNTER — Ambulatory Visit (INDEPENDENT_AMBULATORY_CARE_PROVIDER_SITE_OTHER): Payer: PPO | Admitting: Endocrinology

## 2019-03-14 VITALS — BP 138/72 | HR 83 | Temp 98.5°F | Wt 204.6 lb

## 2019-03-14 DIAGNOSIS — N183 Chronic kidney disease, stage 3 unspecified: Secondary | ICD-10-CM

## 2019-03-14 DIAGNOSIS — E119 Type 2 diabetes mellitus without complications: Secondary | ICD-10-CM

## 2019-03-14 DIAGNOSIS — Z794 Long term (current) use of insulin: Secondary | ICD-10-CM

## 2019-03-14 DIAGNOSIS — E1122 Type 2 diabetes mellitus with diabetic chronic kidney disease: Secondary | ICD-10-CM

## 2019-03-14 LAB — POCT GLYCOSYLATED HEMOGLOBIN (HGB A1C): Hemoglobin A1C: 7.5 % — AB (ref 4.0–5.6)

## 2019-03-14 NOTE — Patient Instructions (Addendum)
check your blood sugar twice a day.  vary the time of day when you check, between before the 3 meals, and at bedtime.  also check if you have symptoms of your blood sugar being too high or too low.  please keep a record of the readings and bring it to your next appointment here (or you can bring the meter itself).  You can write it on any piece of paper.  please call us sooner if your blood sugar goes below 70, or if you have a lot of readings over 200. Please continue the same medications.  Best wishes on your move.  I would be happy to see you back on your return.

## 2019-03-14 NOTE — Progress Notes (Signed)
Subjective:    Patient ID: Bernard Roberts, male    DOB: 12/14/1950, 68 y.o.   MRN: [id number]  HPI Pt returns for f/u of diabetes mellitus:  DM type: Insulin-requiring type 2 Dx'ed: 4627 Complications: polyneuropathy and renal insuff.   Therapy: insulin, trulicity, and 2 oral meds.  DKA: never Severe hypoglycemia: never Pancreatitis: never Pancreatic imaging: never Other: he also took insulin approx 2014-2017; he declines multiple daily injections.   Interval history: pt states he feels well in general.  He says cbg varies from 115-300, but most are in the mid-100's. He is moving to [place] x 1 year.   Past Medical History:  Diagnosis Date  . Anxiety   . Arthritis of right knee   . Back pain   . Depression   . Diabetes mellitus without complication ([place])   . Elevated PSA    Around 15   . Enlarged prostate   . Gout   . Hyperlipidemia   . Hypertension   . Joint pain   . Shortness of breath     Past Surgical History:  Procedure Laterality Date  . KNEE ARTHROPLASTY     unknown which side    Social History   Socioeconomic History  . Marital status: Married    Spouse name: Honor Loh  . Number of children: Not on file  . Years of education: Not on file  . Highest education level: Not on file  Occupational History  . Occupation: [employer], Probation officer  Social Needs  . Financial resource strain: Not on file  . Food insecurity    Worry: Not on file    Inability: Not on file  . Transportation needs    Medical: Not on file    Non-medical: Not on file  Tobacco Use  . Smoking status: Never Smoker  . Smokeless tobacco: Never Used  . Tobacco comment: smoked over 100 cigerettes per lifetime  Substance and Sexual Activity  . Alcohol use: Yes    Alcohol/week: 0.0 standard drinks    Comment: not a drinker 3 to 4 a week   . Drug use: No  . Sexual activity: Not on file  Lifestyle  . Physical activity    Days per week: Not on file    Minutes per session: Not on file  . Stress:  Not on file  Relationships  . Social Herbalist on phone: Not on file    Gets together: Not on file    Attends religious service: Not on file    Active member of club or organization: Not on file    Attends meetings of clubs or organizations: Not on file    Relationship status: Not on file  . Intimate partner violence    Fear of current or ex partner: Not on file    Emotionally abused: Not on file    Physically abused: Not on file    Forced sexual activity: Not on file  Other Topics Concern  . Not on file  Social History Narrative   Works for as  [employer]    He likes to swim, write, hike.     Current Outpatient Medications on File Prior to Visit  Medication Sig Dispense Refill  . aspirin EC 81 MG tablet Take 81 mg by mouth daily.    . Dulaglutide (TRULICITY) 1.5 OJ/5.0KX SOPN INJECT 1.5MG  INTO THE SKIN ONCE A WEEK 1 pen 10  . fenofibrate (TRICOR) 145 MG tablet Take 1 tablet (145 mg total) by  mouth daily. 90 tablet 3  . finasteride (PROSCAR) 5 MG tablet Take 5 mg by mouth daily.    Marland Kitchen. FREESTYLE TEST STRIPS test strip USE TO TEST BLOOD SUGAR TWO TIMES A DAY 100 each 0  . ibuprofen (ADVIL,MOTRIN) 600 MG tablet Take 1 tablet (600 mg total) by mouth every 6 (six) hours as needed. 30 tablet 0  . Insulin Glargine (LANTUS SOLOSTAR) 100 UNIT/ML Solostar Pen Inject 50 Units into the skin daily. And pen needles 1/day 15 pen PRN  . INVOKANA 300 MG TABS tablet TAKE ONE TABLET BY MOUTH EVERY MORNING BEFORE BREAKFAST 30 tablet 0  . Lancets (FREESTYLE) lancets USE TO TEST BLOOD GLUCOSE ONCE DAILY 100 each 3  . losartan (COZAAR) 100 MG tablet TAKE ONE TABLET BY MOUTH DAILY 90 tablet 1  . metFORMIN (GLUCOPHAGE-XR) 500 MG 24 hr tablet TAKE TWO TABLETS BY MOUTH EVERY MORNING AND TAKE THREE TABLETS BY MOUTH EVERY EVENING 120 tablet 1  . sildenafil (VIAGRA) 50 MG tablet Take 1-2 tablets as needed 30 tablet 6  . tamsulosin (FLOMAX) 0.4 MG CAPS capsule TAKE ONE CAPSULE BY MOUTH DAILY 90 capsule 1  .  testosterone cypionate (DEPOTESTOSTERONE CYPIONATE) 200 MG/ML injection Inject 200 mg into the muscle every 14 (fourteen) days.    . Vitamin D, Ergocalciferol, (DRISDOL) 1.25 MG (50000 UT) CAPS capsule Take 1 capsule (50,000 Units total) by mouth every 7 (seven) days. 4 capsule 0  . zolpidem (AMBIEN) 5 MG tablet TAKE ONE TABLET BY MOUTH EVERY NIGHT AT BEDTIME 30 tablet 2   No current facility-administered medications on file prior to visit.     Allergies  Allergen Reactions  . Victoza [Liraglutide] Nausea And Vomiting    Family History  Problem Relation Age of Onset  . Depression Mother   . Anxiety disorder Mother   . Obesity Mother   . Diabetes Father   . Heart disease Father   . Hypertension Father   . Stroke Father   . High Cholesterol Father   . Depression Father   . Obesity Father   . Diabetes Sister   . Mental illness Sister        Parnoid   . Alcohol abuse Brother   . Hypertension Brother     BP 138/72 (BP Location: Right Arm, Patient Position: Sitting, Cuff Size: Normal)   Pulse 83   Temp 98.5 F (36.9 C) (Oral)   Wt 204 lb 9.6 oz (92.8 kg)   SpO2 94%   BMI 31.11 kg/m    Review of Systems He denies hypoglycemia.      Objective:   Physical Exam VITAL SIGNS:  See vs page GENERAL: no distress Pulses: dorsalis pedis intact bilat.   MSK: no deformity of the feet CV: no leg edema Skin:  no ulcer on the feet.  normal color and temp on the feet.  Neuro: sensation is intact to touch on the feet, but decreased from normal.   Ext: There is bilateral onychomycosis of the toenails.    A1c=7.5%.    Lab Results  Component Value Date   CREATININE 1.43 09/13/2018   BUN 32 (H) 09/13/2018   NA 138 09/13/2018   K 4.1 09/13/2018   CL 104 09/13/2018   CO2 25 09/13/2018      Assessment & Plan:  Insulin-requiring type 2 DM: this is the best control this pt should aim for, given this regimen, which does match insulin to his changing needs throughout the day.    Patient Instructions  check your blood sugar twice a day.  vary the time of day when you check, between before the 3 meals, and at bedtime.  also check if you have symptoms of your blood sugar being too high or too low.  please keep a record of the readings and bring it to your next appointment here (or you can bring the meter itself).  You can write it on any piece of paper.  please call us sooner if your blood sugar goes below 70, or if you have a lot of readings over 200. Please continue the same medications.  Best wishes on your move.  I would be happy to see you back on your return.

## 2019-03-19 ENCOUNTER — Other Ambulatory Visit: Payer: Self-pay | Admitting: Endocrinology

## 2019-03-26 ENCOUNTER — Encounter: Payer: Self-pay | Admitting: Endocrinology

## 2019-03-26 ENCOUNTER — Other Ambulatory Visit: Payer: Self-pay | Admitting: Endocrinology

## 2019-03-27 ENCOUNTER — Other Ambulatory Visit: Payer: Self-pay | Admitting: Endocrinology

## 2019-03-27 MED ORDER — CANAGLIFLOZIN 300 MG PO TABS: 300.0000 mg | ORAL_TABLET | Freq: Every day | ORAL | 3 refills | Status: AC

## 2019-03-27 NOTE — Telephone Encounter (Signed)
Spoke with patient yesterday in regards to his RX being declined and provided the reason to patient. He became very upset and stated he felt that he should be under Dr.Ellisons care for 30 more days. Patient then disconnected.  Spoke with Patient again this morning. Stated his departure was delayed but he will now be leaving today. He requested that Dr.Ellison at least send in 10 pills to get him by.

## 2019-05-22 ENCOUNTER — Ambulatory Visit: Payer: PPO

## 2021-05-01 ENCOUNTER — Emergency Department (INDEPENDENT_AMBULATORY_CARE_PROVIDER_SITE_OTHER): Payer: Medicare PPO

## 2021-05-01 ENCOUNTER — Other Ambulatory Visit: Payer: Self-pay

## 2021-05-01 ENCOUNTER — Emergency Department
Admission: EM | Admit: 2021-05-01 | Discharge: 2021-05-01 | Disposition: A | Discharge status: Home / Self Care | Payer: Medicare PPO | Source: Home / Self Care

## 2021-05-01 DIAGNOSIS — R29898 Other symptoms and signs involving the musculoskeletal system: Secondary | ICD-10-CM | POA: Diagnosis not present

## 2021-05-01 DIAGNOSIS — M25421 Effusion, right elbow: Secondary | ICD-10-CM

## 2021-05-01 DIAGNOSIS — M109 Gout, unspecified: Secondary | ICD-10-CM | POA: Diagnosis not present

## 2021-05-01 DIAGNOSIS — M25521 Pain in right elbow: Secondary | ICD-10-CM

## 2021-05-01 MED ORDER — COLCHICINE 0.6 MG PO TABS: ORAL_TABLET | ORAL | 0 refills | Status: AC

## 2021-05-01 NOTE — Discharge Instructions (Addendum)
Advised patient to take medication as directed.  Advised patient if right elbow pain worsens and/or unresolved please follow-up with PCP or here for further evaluation.

## 2021-05-01 NOTE — ED Triage Notes (Addendum)
Pt presents to Urgent Care with c/o R elbow pain, swelling, and decreased ROM since yesterday. Does not recall injury. R elbow noted to be red/swollen.

## 2021-05-01 NOTE — ED Provider Notes (Signed)
[facility]    CSN: [id number] Arrival date & time: 05/01/21  1428      History   Chief Complaint Chief Complaint  Patient presents with   Elbow Pain    Right    HPI ERBY SANDERSON is a 70 y.o. male.   HPI 70 year old male presents with right elbow pain, swelling, and decreased range of motion since yesterday.  Denies injury or insult to the right elbow.  PMH significant for gout.  Past Medical History:  Diagnosis Date   Anxiety    Arthritis of right knee    Back pain    Depression    Diabetes mellitus without complication ([facility])    Elevated PSA    Around 15    Enlarged prostate    Gout    Hyperlipidemia    Hypertension    Joint pain    Shortness of breath     Patient Active Problem List   Diagnosis Date Noted   Hypertriglyceridemia 12/31/2018   Vitamin D deficiency 12/11/2018   Class 1 obesity with serious comorbidity and body mass index (BMI) of 30.0 to 30.9 in adult 11/08/2018   Gout    Elevated PSA    Hypertension    Hyperlipidemia 05/21/2016   Hypogonadism in male 05/04/2010   Diabetes type 2, controlled ([facility]) 02/23/2010    Past Surgical History:  Procedure Laterality Date   KNEE ARTHROPLASTY     unknown which side       Home Medications    Prior to Admission medications   Medication Sig Start Date End Date Taking? Authorizing Provider  acetaminophen (TYLENOL) 325 MG tablet Take 650 mg by mouth every 6 (six) hours as needed.   Yes [provider]  colchicine 0.6 MG tablet Take 2 tabs p.o. now, may repeat 1 tab in 1-2 hours if right elbow pain persist. DO NOT EXCEED 3 TABS IN 24 HOURS. 05/01/21  Yes Trevor Iha, FNP  aspirin EC 81 MG tablet Take 81 mg by mouth daily.    [provider]  canagliflozin (INVOKANA) 300 MG TABS tablet Take 1 tablet (300 mg total) by mouth daily with breakfast. 03/27/19   Romero Belling, MD  Dulaglutide (TRULICITY) 1.5 MG/0.5ML SOPN INJECT 1.5MG  INTO THE SKIN ONCE A WEEK 11/26/18    Romero Belling, MD  fenofibrate (TRICOR) 145 MG tablet Take 1 tablet (145 mg total) by mouth daily. 09/13/18   Nafziger, Kandee Keen, NP  finasteride (PROSCAR) 5 MG tablet Take 5 mg by mouth daily.    [provider]  FREESTYLE TEST STRIPS test strip USE TO TEST BLOOD SUGAR TWO TIMES A DAY 03/20/19   Romero Belling, MD  ibuprofen (ADVIL,MOTRIN) 600 MG tablet Take 1 tablet (600 mg total) by mouth every 6 (six) hours as needed. 03/15/18   Domenick Gong, MD  Insulin Glargine (LANTUS SOLOSTAR) 100 UNIT/ML Solostar Pen Inject 50 Units into the skin daily. And pen needles 1/day 01/15/19   Romero Belling, MD  Lancets (FREESTYLE) lancets USE TO TEST BLOOD GLUCOSE ONCE DAILY 12/22/17   Nafziger, Kandee Keen, NP  losartan (COZAAR) 100 MG tablet TAKE ONE TABLET BY MOUTH DAILY 02/06/19   Nafziger, Kandee Keen, NP  metFORMIN (GLUCOPHAGE-XR) 500 MG 24 hr tablet TAKE TWO TABLETS BY MOUTH EVERY MORNING AND TAKE THREE TABLETS BY MOUTH EVERY EVENING 02/14/19   Romero Belling, MD  sildenafil (VIAGRA) 50 MG tablet Take 1-2 tablets as needed 06/09/17   Shirline Frees, NP  tamsulosin (FLOMAX) 0.4 MG CAPS capsule TAKE ONE  CAPSULE BY MOUTH DAILY 10/29/18   Nafziger, Kandee Keen, NP  testosterone cypionate (DEPOTESTOSTERONE CYPIONATE) 200 MG/ML injection Inject 200 mg into the muscle every 14 (fourteen) days. 06/08/17   [provider]  Vitamin D, Ergocalciferol, (DRISDOL) 1.25 MG (50000 UT) CAPS capsule Take 1 capsule (50,000 Units total) by mouth every 7 (seven) days. 02/11/19   Langston Reusing, MD  zolpidem (AMBIEN) 5 MG tablet TAKE ONE TABLET BY MOUTH EVERY NIGHT AT BEDTIME 02/26/19   Nafziger, Kandee Keen, NP    Family History Family History  Problem Relation Age of Onset   Depression Mother    Anxiety disorder Mother    Obesity Mother    Diabetes Father    Heart disease Father    Hypertension Father    Stroke Father    High Cholesterol Father    Depression Father    Obesity Father    Diabetes Sister    Mental illness Sister         Parnoid    Alcohol abuse Brother    Hypertension Brother     Social History Social History   Tobacco Use   Smoking status: Never   Smokeless tobacco: Never   Tobacco comments:    smoked over 100 cigerettes per lifetime  Vaping Use   Vaping Use: Never used  Substance Use Topics   Alcohol use: Yes    Alcohol/week: 5.0 standard drinks    Types: 5 Standard drinks or equivalent per week    Comment: weekly   Drug use: No     Allergies   Victoza [liraglutide]   Review of Systems Review of Systems  Musculoskeletal:        Right elbow pain x 1 day  Skin:  Positive for rash.  All other systems reviewed and are negative.   Physical Exam Triage Vital Signs ED Triage Vitals  Enc Vitals Group     BP 05/01/21 1525 (!) 150/94     Pulse Rate 05/01/21 1525 (!) 109     Resp 05/01/21 1525 20     Temp 05/01/21 1525 98.4 F (36.9 C)     Temp Source 05/01/21 1525 Oral     SpO2 05/01/21 1525 97 %     Weight 05/01/21 1526 195 lb (88.5 kg)     Height 05/01/21 1526 5' 8.5" (1.74 m)     Head Circumference --      Peak Flow --      Pain Score 05/01/21 1526 8     Pain Loc --      Pain Edu? --      Excl. in GC? --    No data found.  Updated Vital Signs BP (!) 147/82 (BP Location: Left Arm)   Pulse (!) 109   Temp 98.4 F (36.9 C) (Oral)   Resp 20   Ht 5' 8.5" (1.74 m)   Wt 195 lb (88.5 kg)   SpO2 97%   BMI 29.22 kg/m    Physical Exam Vitals and nursing note reviewed.  Constitutional:      General: He is not in acute distress.    Appearance: Normal appearance. He is normal weight. He is not ill-appearing.  HENT:     Head: Normocephalic and atraumatic.     Mouth/Throat:     Mouth: Mucous membranes are moist.     Pharynx: Oropharynx is clear.  Eyes:     Extraocular Movements: Extraocular movements intact.     Conjunctiva/sclera: Conjunctivae normal.     Pupils: Pupils  are equal, round, and reactive to light.  Cardiovascular:     Rate and Rhythm: Normal rate and  regular rhythm.     Pulses: Normal pulses.     Heart sounds: Normal heart sounds.  Pulmonary:     Effort: Pulmonary effort is normal.     Breath sounds: Normal breath sounds. No wheezing, rhonchi or rales.  Musculoskeletal:        General: Swelling and tenderness present. No deformity.     Cervical back: Normal range of motion and neck supple. No rigidity or tenderness.     Comments: Right elbow: TTP over lateral inferior olecranon, erythematous, with mild soft tissue swelling, warm hot to touch, mild to moderate pain elicited with flexion/extension per patient.  Lymphadenopathy:     Cervical: No cervical adenopathy.  Skin:    General: Skin is warm and dry.  Neurological:     General: No focal deficit present.     Mental Status: He is alert and oriented to person, place, and time. Mental status is at baseline.  Psychiatric:        Mood and Affect: Mood normal.        Behavior: Behavior normal.        Thought Content: Thought content normal.     UC Treatments / Results  Labs (all labs ordered are listed, but only abnormal results are displayed) Labs Reviewed - No data to display  EKG   Radiology DG Elbow Complete Right  Result Date: 05/01/2021 CLINICAL DATA:  Swelling.  Pain.  Decreased range of motion. EXAM: RIGHT ELBOW - COMPLETE 3+ VIEW COMPARISON:  None. FINDINGS: No acute fracture or dislocation. Suspect mild pre olecranon soft tissue swelling. Joint effusion, as evidenced by elevated anterior and presence of a posterior fat pad on the lateral view. IMPRESSION: Moderate-sized joint effusion with suspicion of pre olecranon soft tissue swelling. Electronically Signed   By: Jeronimo Greaves M.D.   On: 05/01/2021 16:14    Procedures Procedures (including critical care time)  Medications Ordered in UC Medications - No data to display  Initial Impression / Assessment and Plan / UC Course  I have reviewed the triage vital signs and the nursing notes.  Pertinent labs & imaging  results that were available during my care of the patient were reviewed by me and considered in my medical decision making (see chart for details).     MDM: 1. Right elbow pain-right elbow x-ray within normal limits without acute abnormality; 2.  Acute gout flare of right elbow-Rx'd Colchicine.  Patient discharged home, hemodynamically stable. Final Clinical Impressions(s) / UC Diagnoses   Final diagnoses:  Right elbow pain  Acute gout of right elbow, unspecified cause     Discharge Instructions      Advised patient to take medication as directed.  Advised patient if right elbow pain worsens and/or unresolved please follow-up with PCP or here for further evaluation.     ED Prescriptions     Medication Sig Dispense Auth. Provider   colchicine 0.6 MG tablet Take 2 tabs p.o. now, may repeat 1 tab in 1-2 hours if right elbow pain persist. DO NOT EXCEED 3 TABS IN 24 HOURS. 21 tablet Trevor Iha, FNP      PDMP not reviewed this encounter.   Trevor Iha, FNP 05/01/21 1659
